# Patient Record
Sex: Female | Born: 1955 | Race: Black or African American | Hispanic: No | Marital: Single | Smoking: Never smoker
Health system: Southern US, Community
[De-identification: ages and names within clinical notes are randomized; demographics above are authoritative.]

## PROBLEM LIST (undated history)

## (undated) DIAGNOSIS — G809 Cerebral palsy, unspecified: Secondary | ICD-10-CM

## (undated) DIAGNOSIS — R569 Unspecified convulsions: Secondary | ICD-10-CM

## (undated) DIAGNOSIS — G40909 Epilepsy, unspecified, not intractable, without status epilepticus: Secondary | ICD-10-CM

## (undated) DIAGNOSIS — I10 Essential (primary) hypertension: Secondary | ICD-10-CM

## (undated) HISTORY — PX: BREAST SURGERY: SHX581

## (undated) HISTORY — DX: Unspecified convulsions: R56.9

## (undated) HISTORY — PX: OTHER SURGICAL HISTORY: SHX169

## (undated) HISTORY — PX: BACK SURGERY: SHX140

## (undated) HISTORY — DX: Epilepsy, unspecified, not intractable, without status epilepticus: G40.909

## (undated) HISTORY — PX: ABDOMINAL HYSTERECTOMY: SHX81

---

## 2006-08-28 ENCOUNTER — Inpatient Hospital Stay (HOSPITAL_COMMUNITY): Admission: EM | Admit: 2006-08-28 | Discharge: 2006-08-29 | Payer: Self-pay | Admitting: Emergency Medicine

## 2006-09-25 ENCOUNTER — Emergency Department (HOSPITAL_COMMUNITY): Admission: EM | Admit: 2006-09-25 | Discharge: 2006-09-25 | Payer: Self-pay | Admitting: Emergency Medicine

## 2006-10-31 ENCOUNTER — Emergency Department (HOSPITAL_COMMUNITY): Admission: EM | Admit: 2006-10-31 | Discharge: 2006-10-31 | Payer: Self-pay | Admitting: Emergency Medicine

## 2006-12-11 ENCOUNTER — Inpatient Hospital Stay (HOSPITAL_COMMUNITY): Admission: EM | Admit: 2006-12-11 | Discharge: 2006-12-17 | Payer: Self-pay | Admitting: Emergency Medicine

## 2007-02-01 ENCOUNTER — Encounter (HOSPITAL_COMMUNITY): Admission: RE | Admit: 2007-02-01 | Discharge: 2007-03-03 | Payer: Self-pay | Admitting: Family Medicine

## 2007-03-05 ENCOUNTER — Encounter (HOSPITAL_COMMUNITY): Admission: RE | Admit: 2007-03-05 | Discharge: 2007-04-04 | Payer: Self-pay | Admitting: Family Medicine

## 2007-04-09 ENCOUNTER — Encounter (HOSPITAL_COMMUNITY): Admission: RE | Admit: 2007-04-09 | Discharge: 2007-05-01 | Payer: Self-pay | Admitting: Family Medicine

## 2007-08-15 ENCOUNTER — Ambulatory Visit (HOSPITAL_COMMUNITY): Admission: RE | Admit: 2007-08-15 | Discharge: 2007-08-15 | Payer: Self-pay | Admitting: Family Medicine

## 2007-08-29 ENCOUNTER — Ambulatory Visit (HOSPITAL_COMMUNITY): Admission: RE | Admit: 2007-08-29 | Discharge: 2007-08-29 | Payer: Self-pay | Admitting: Family Medicine

## 2007-12-24 ENCOUNTER — Ambulatory Visit (HOSPITAL_COMMUNITY): Admission: RE | Admit: 2007-12-24 | Discharge: 2007-12-24 | Payer: Self-pay | Admitting: Family Medicine

## 2009-03-30 ENCOUNTER — Encounter: Admission: RE | Admit: 2009-03-30 | Discharge: 2009-04-30 | Payer: Self-pay | Admitting: Neurology

## 2009-06-14 IMAGING — CT CT HEAD W/O CM
1 series · 16 of 30 positions shown, 20 images · non-contrast
Comparison: None.

CLINICAL DATA: Seizure. Cerebral palsy.

HEAD CT WITHOUT CONTRAST
TECHNIQUE: 5mm collimated images were obtained from the base of the skull
through the vertex according to standard protocol without contrast.

[Series 5: headseq 4.8 h37s · axial · 0.55mm/px · z∈[+98,+250]mm · 16 of 36 slices shown, 20 images]
[im 2/36  brain]
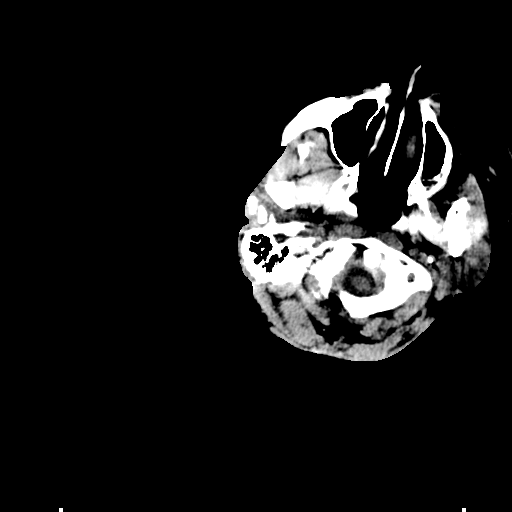
[im 2/36  bone]
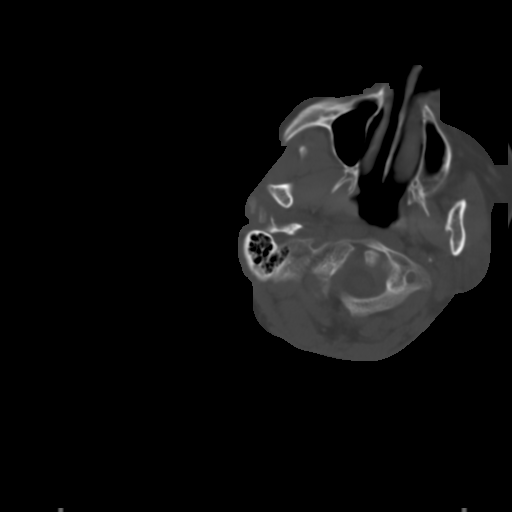
[im 4/36  brain]
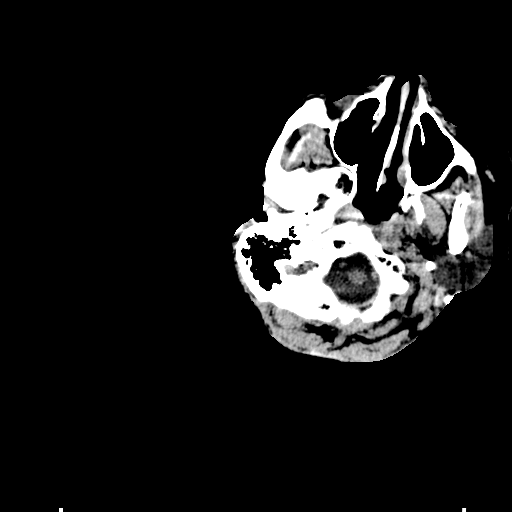
[im 7/36  brain]
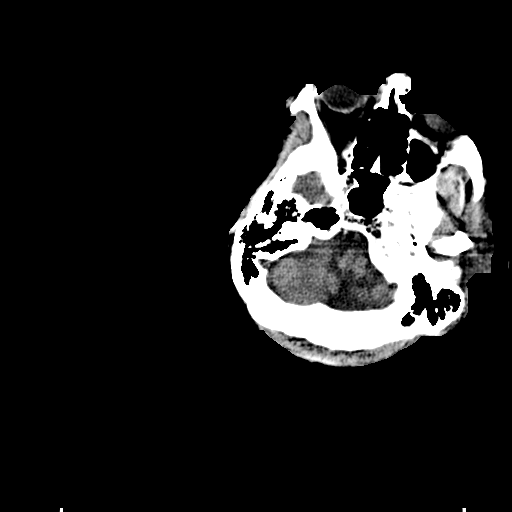
[im 9/36  brain]
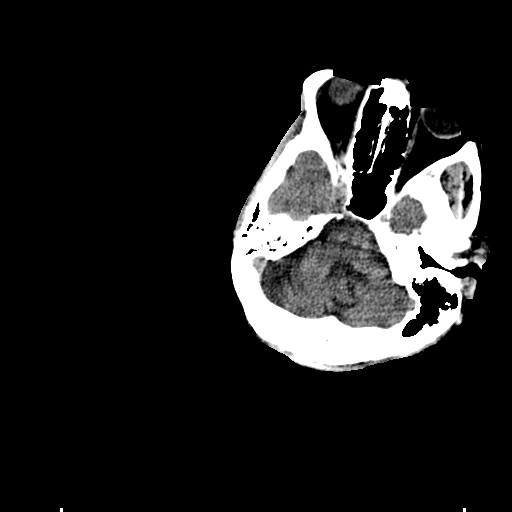
[im 10/36  brain]
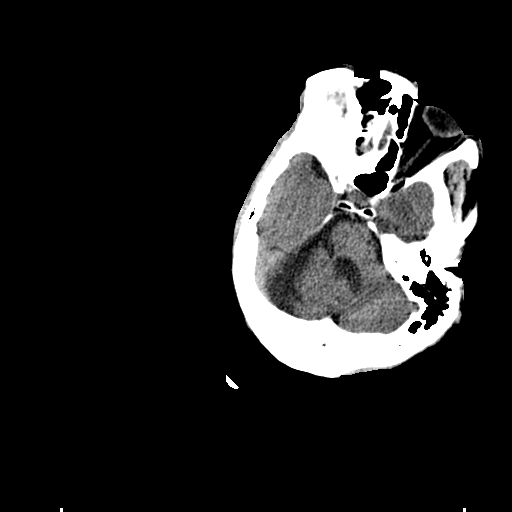
[im 10/36  bone]
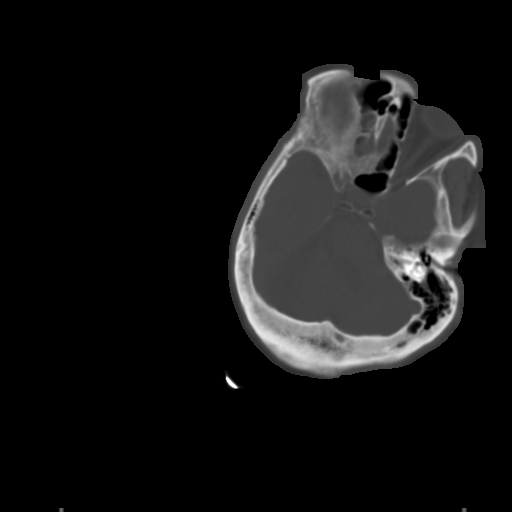
[im 13/36  brain]
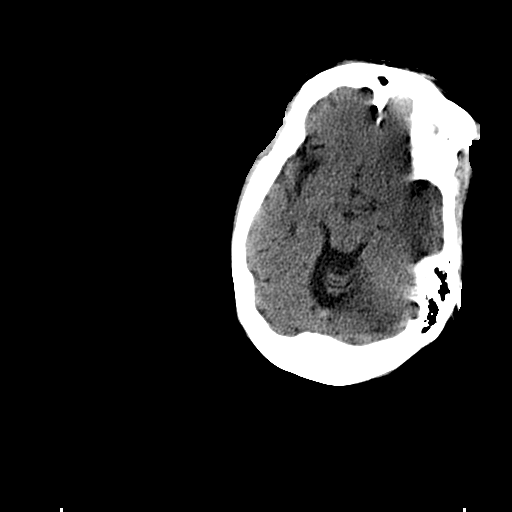
[im 15/36  brain]
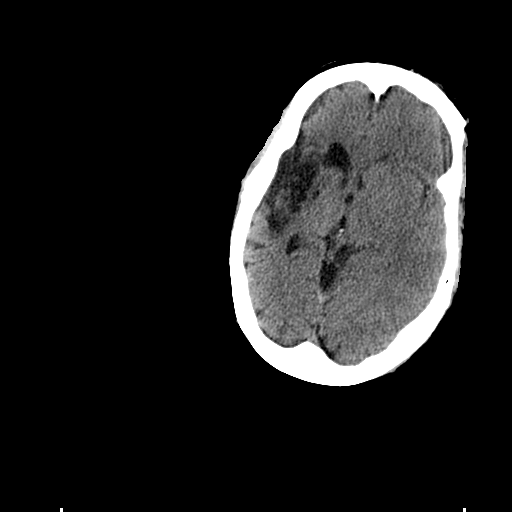
[im 17/36  brain]
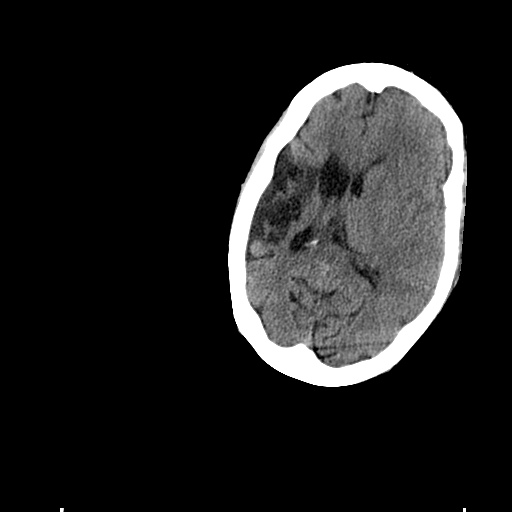
[im 19/36  brain]
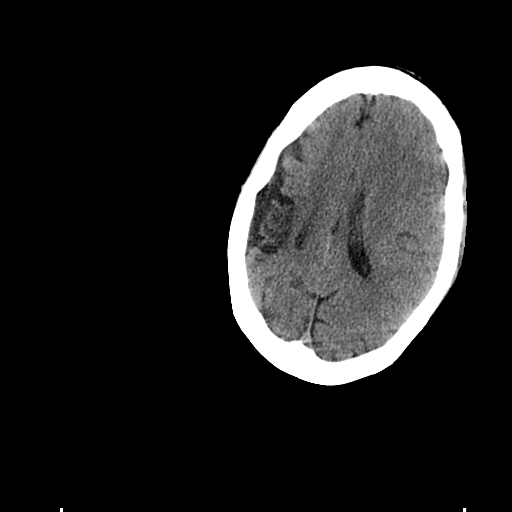
[im 19/36  bone]
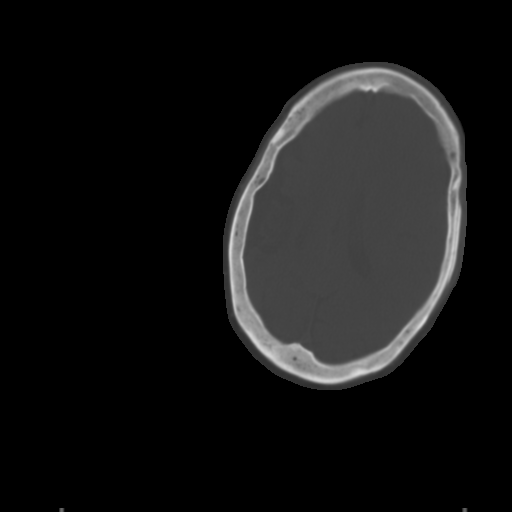
[im 21/36  brain]
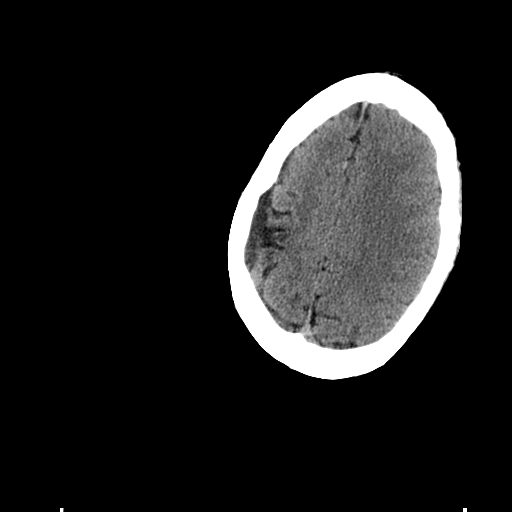
[im 23/36  brain]
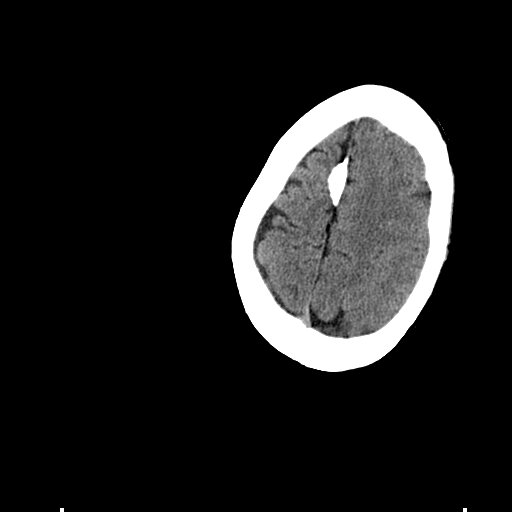
[im 26/36  brain]
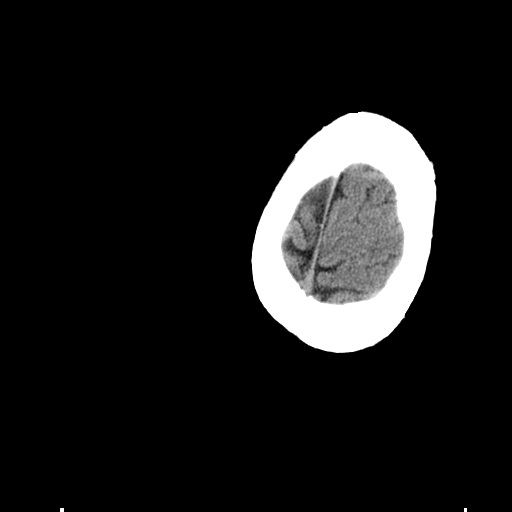
[im 27/36  brain]
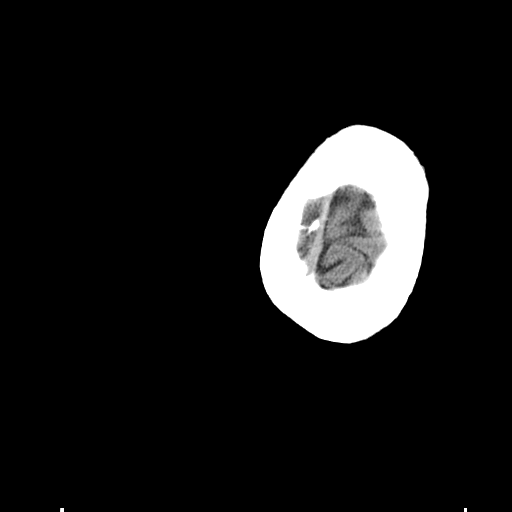
[im 27/36  bone]
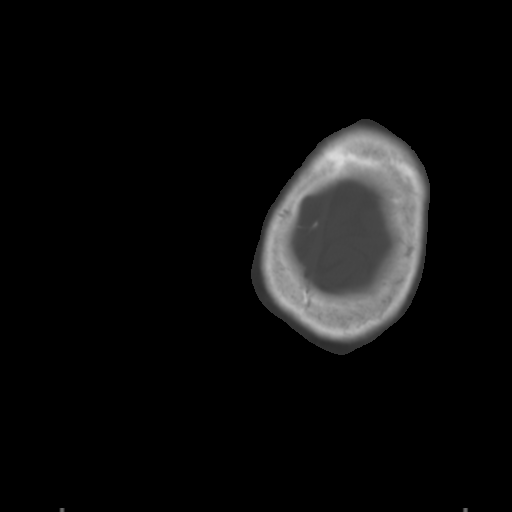
[im 29/36  brain]
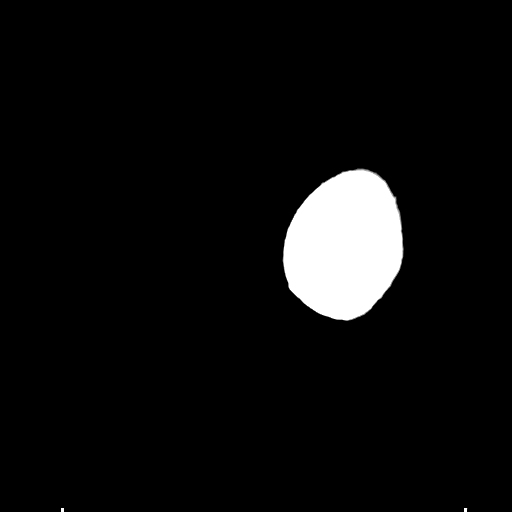
[im 32/36  brain]
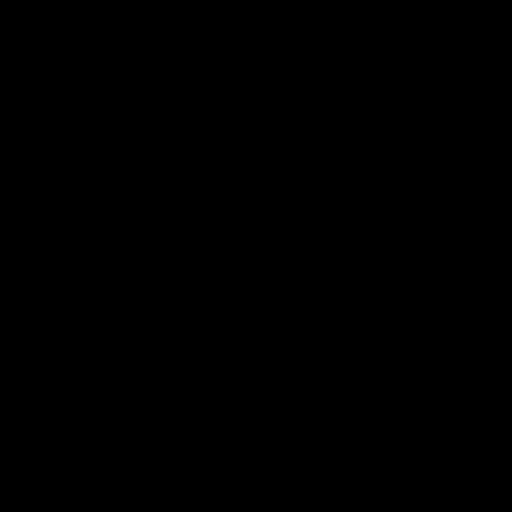
[im 34/36  brain]
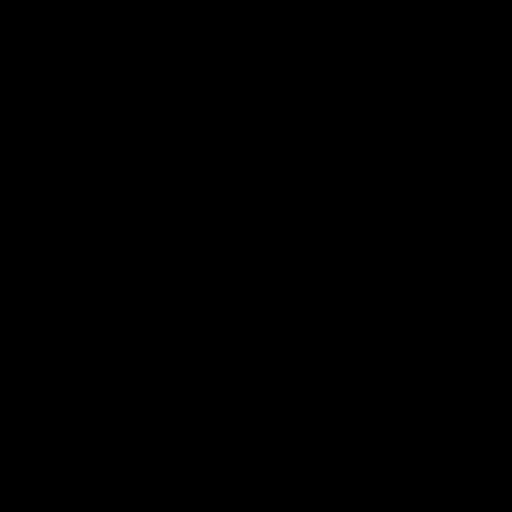

[16 of 30 positions shown; findings below may reference images not displayed]

FINDINGS: Limited ability to position the patient, resulting in an oblique
scanning plane. Mildly enlarged ventricles and subarachnoid spaces. Old right
middle cerebral artery distribution infarct with ex vacuo enlargement of the
adjacent right lateral ventricle. Old right basal ganglia lacunar infarct. No
intracranial hemorrhage or mass-effect. Unremarkable bones and included portions
of the paranasal sinuses.

IMPRESSION

1. Old right middle cerebral artery distribution infarct.
2. Mild diffuse cerebral and cerebellar atrophy.
3. No acute abnormality.

## 2010-02-04 ENCOUNTER — Encounter
Admission: RE | Admit: 2010-02-04 | Discharge: 2010-04-20 | Payer: Self-pay | Source: Home / Self Care | Attending: Family Medicine | Admitting: Family Medicine

## 2010-05-22 ENCOUNTER — Encounter: Payer: Self-pay | Admitting: Family Medicine

## 2010-07-07 ENCOUNTER — Emergency Department (HOSPITAL_COMMUNITY): Payer: Medicare Other

## 2010-07-07 ENCOUNTER — Emergency Department (HOSPITAL_COMMUNITY)
Admission: EM | Admit: 2010-07-07 | Discharge: 2010-07-07 | Disposition: A | Discharge status: Home / Self Care | Payer: Medicare Other | Attending: Emergency Medicine | Admitting: Emergency Medicine

## 2010-07-07 DIAGNOSIS — G809 Cerebral palsy, unspecified: Secondary | ICD-10-CM | POA: Insufficient documentation

## 2010-07-07 DIAGNOSIS — R51 Headache: Secondary | ICD-10-CM | POA: Insufficient documentation

## 2010-07-07 DIAGNOSIS — S0180XA Unspecified open wound of other part of head, initial encounter: Secondary | ICD-10-CM | POA: Insufficient documentation

## 2010-07-07 DIAGNOSIS — Y92009 Unspecified place in unspecified non-institutional (private) residence as the place of occurrence of the external cause: Secondary | ICD-10-CM | POA: Insufficient documentation

## 2010-07-07 DIAGNOSIS — G40909 Epilepsy, unspecified, not intractable, without status epilepticus: Secondary | ICD-10-CM | POA: Insufficient documentation

## 2010-07-07 DIAGNOSIS — W07XXXA Fall from chair, initial encounter: Secondary | ICD-10-CM | POA: Insufficient documentation

## 2010-07-13 ENCOUNTER — Emergency Department (HOSPITAL_COMMUNITY)
Admission: EM | Admit: 2010-07-13 | Discharge: 2010-07-13 | Disposition: A | Discharge status: Home / Self Care | Payer: Medicare Other | Attending: Emergency Medicine | Admitting: Emergency Medicine

## 2010-07-13 DIAGNOSIS — Z4802 Encounter for removal of sutures: Secondary | ICD-10-CM | POA: Insufficient documentation

## 2010-09-13 NOTE — Group Therapy Note (Signed)
NAMERAYHANA, SLIDER                ACCOUNT NO.:  [id number]   MEDICAL RECORD NO.:  [id number]          PATIENT TYPE:  INP   LOCATION:  A215                          FACILITY:  [facility]   PHYSICIAN:  Skeet Latch, DO    DATE OF BIRTH:  1955-05-23   DATE OF PROCEDURE:  DATE OF DISCHARGE:                                 PROGRESS NOTE   SUBJECTIVE:  Ms. Linville continues to do well on present.  No family  members present on exam.  No obvious jerking, tremors or seizure  activity, my exam.  The patient denies any chest pain, abdominal pain,  shortness of breath or headaches.  The patient is stable, in no acute  distress.   OBJECTIVE:  VITAL SIGNS:  Today temperature is 97.5, pulse 80,  respirations 20, blood pressure 122/77.  The patient is satting 99% on  room air.  CARDIOVASCULAR:  Regular rate and rhythm.  No rubs, gallops  or murmurs.  LUNGS:  Clear to auscultation bilaterally.  No rales, rhonchi or  wheezing.  ABDOMEN:  Obese, soft, nontender, nondistended, positive bowel sounds.  EXTREMITIES:  No clubbing, cyanosis or edema.   LABORATORY DATA:  Dilantin level today is 32.3, a final urine culture  negative.   ASSESSMENT:  1. Intractable epilepsy.  The patient continues to be on Keppra and is      being followed by neurology.  2. Dilantin toxicity.  The patient missed Dilantin level is slowly      trending down.  No evidence of any cardiovascular effects at this      time.   This is a 51-year [place] American female with a long history of  seizures, who presented with a 2-week history of progressive seizures.  The patient was seen on August 19, 2006 and discharged on August 29, 2006  for similar problems.  Will send him home on Keppra and brand name  Dilantin.  She presented with seizure-type activity and was found to  have a Dilantin level of approximately 40.  Neurology has been following  the patient.  The patient's Dilantin has been discontinued and has been  on Keppra, with  dosage being adjusted by neurology.  The patient as  shown no cardiovascular effects and was initially in the ICU and has  been transferred to the floor.  At this time, the patient is stable.  I  have been awaiting and/or neurology recommendations regarding the  patient can be discharged. with Dilantin level in the 30s.  Per last  note, the patient level needs to be around 25, and her Dilantin may be  read possibly be re-started.  The patient continues to do well.      Skeet Latch, DO  Electronically Signed     SM/MEDQ  D:  12/15/2006  T:  12/16/2006  Job:  [id number]

## 2010-09-13 NOTE — Procedures (Signed)
NAMELYNIX, BONINE                ACCOUNT NO.:  [id number]   MEDICAL RECORD NO.:  [id number]          PATIENT TYPE:  INP   LOCATION:  IC09                          FACILITY:  [facility]   PHYSICIAN:  Kofi A. Gerilyn Pilgrim, M.D. DATE OF BIRTH:  [birth year]   DATE OF PROCEDURE:  DATE OF DISCHARGE:                              EEG INTERPRETATION   HISTORY OF PRESENT ILLNESS:  This is a 55 year old lady who has a known  history of seizures.  She presents with frequent jerking movements  involving the upper extremities.   MEDICATIONS:  1. Keppra.  2. Dilantin.  3. Phenergan.  4. Protonix.  5. Lovenox.  6. Tylenol.   PROCEDURE:  This is a 16-channel recording that has gone on for  approximately 24 minutes.  There is a posterior rhythm of 8 Hz which  attenuates with eye opening.  Beta activity is observed in the frontal  areas.  Awake and drowsiness is observed throughout the recording.  Photic stimulation is observed without significant changes in the  background activity.  There is no focal or lateralized slowing observed.  The patient is noted to have a few sharp wave activity which phases  reverses at T4.  She also has some movement and twitches throughout the  recording but these twitches show no electrographic correlates.   IMPRESSION:  This is a normal recording showing right temporal  epileptiform discharges, however, they do not correlate with the  twitches which the patient has throughout the recording.      Kofi A. Gerilyn Pilgrim, M.D.  Electronically Signed     KAD/MEDQ  D:  12/13/2006  T:  12/13/2006  Job:  [id number]

## 2010-09-13 NOTE — Consult Note (Signed)
[name], [name]                ACCOUNT NO.:  [id number]   MEDICAL RECORD NO.:  [id number]          PATIENT TYPE:  INP   LOCATION:  IC09                          FACILITY:  [facility]   PHYSICIAN:  Kofi A. Gerilyn Pilgrim, M.D. DATE OF BIRTH:  [birth year]   DATE OF CONSULTATION:  12/12/2006  DATE OF DISCHARGE:                                 CONSULTATION   This is a 55 year old black female who has a known history of epilepsy  and mental retardation.  The patient has had intractable seizures and  was started on Keppra a few months ago.  She has had problems with  Dilantin, and this medication was reduced because of toxicity  previously.  It appears that the patient had a seizure about two weeks  ago and may have been taken to the emergency room. It is not quite clear  exactly what transpired, but she did contact our office, and they were  instructed to leave the Dilantin unchanged but to increase the Keppra.  In any situation, it appears that the patient's mother increased the  Dilantin to 3 tablets a day.  It appears that this was done about two  weeks ago.  She subsequently has had what appears to be near continuous  myoclonal jerks since that time.  When she was last seen in the  emergency room 07/02, her Dilantin level was 17 on a Dilantin dose of  200 mg a day.  The patient was taken to the emergency room today because  of this increasing myoclonic jerking activity.  She may have even had  another seizure event, but this is unclear.  The patient related that  the main thing of concern has been the near continuous jerking activity  involving the upper and lower extremities.   PAST MEDICAL HISTORY:  Again, as stated in the History of Present  Illness, cerebral palsy, mental retardation, epilepsy.   PAST SURGICAL HISTORY:  Back surgery.   ALLERGIES:  1. Codeine.  2. Benadryl.  3. Phenobarbital  4. Aspirin.   MEDICATIONS:  1. Dilantin.  The dose of Dilantin is 200 mg at bedtime, but  she was      actually taking 300 mg.  2. Keppra.  It appears that she was taking 500 mg 1 in the morning, 1      in the afternoon and 2 at bedtime.   PHYSICAL EXAMINATION:  VITAL SIGNS:  Temperature 98.5, pulse 84,  respirations 12, blood pressure 90's/61.  HEENT:  Head is normocephalic, atraumatic.  NECK:  Supple.  ABDOMEN:  Soft.  MENTATION:  Patient is awake.  She has her baseline severe dysarthria,  but converses in simple, comprehendible sentences.  She also follows  commands bilaterally.  CRANIAL NERVE EVALUATION:  She does have a strabismus but extraocular  movements are full.  Interestingly, I see no clear nystagmus.  Pupils  are 5 mm and reactive.  Facial-muscle strength is symmetric.  Tongue is  midline.  MOTOR:  Motor examination shows spasticity in both the upper and lower  extremities.  Strength in the upper extremities is 4 , and  the leg  strength is 2/5 with markedly increased spasticity.  Coordination does  show some dysmetria involving the upper extremities.  SENSATION:  Sensation is normal to light touch and temperature  bilaterally.   LABORATORY DATA:  Dilantin level on admission is 40.  It is unclear if  this was before Dilantin was bolused at 500 mg.  She did receive the  bolus before the blood levels came back, however.  Magnesium 1.9, CPK  24.  Urinalysis negative.  Sodium 143, potassium 4.0, chloride 109, CO2  34, glucose 92, BUN 9, creatinine 0.4, alkaline phosphatase 255, AST 18,  ALT 12, calcium 8.9, albumin 6.8.  WBC 3.5, hemoglobin 11, platelet  count of 234.   ASSESSMENT:  1. Intractable epilepsy.  2. Near continuous monoclonal jerks.  I suspect that this is due to      Dilantin toxicity.  She does appear to have some flapping at times.      Certainly, this could be due to epileptic myoclonus, but this has      to be ruled in or ruled out.   RECOMMENDATIONS:  She will likely need to increase the Keppra for better  seizure control.  We may go ahead  and double the dose gradually to 3  grams a day.  We should leave the maintenance dose of Dilantin at 200 mg  a day.  This will be restarted once her Dilantin level gets to about to  the low 20's.  I am also  going to recommend an EEG.   Thanks for this consultation.      Kofi A. Gerilyn Pilgrim, M.D.  Electronically Signed     KAD/MEDQ  D:  12/12/2006  T:  12/13/2006  Job:  [id number]

## 2010-09-13 NOTE — H&P (Signed)
[name], [name]                ACCOUNT NO.:  [id number]   MEDICAL RECORD NO.:  [id number]          PATIENT TYPE:  INP   LOCATION:  IC09                          FACILITY:  [facility]   PHYSICIAN:  Skeet Latch, DO    DATE OF BIRTH:  30-Jan-1956   DATE OF ADMISSION:  12/11/2006  DATE OF DISCHARGE:  LH                              HISTORY & PHYSICAL   CHIEF COMPLAINT:  Seizures.   HISTORY OF PRESENT ILLNESS:  This is a 55 year old, African-American  female who has a long history of seizures who presents with a 2- to 3-  week history of progressive seizures.  History is provided by family who  are present on exam.  Apparently, the patient has been having increasing  seizure activity over the past 2-3 weeks.  The patient has been taking  Keppra and Dilantin for these seizures for quite some time.  The patient  was seen at [facility] on August 19, 2006, and discharged on  August 29, 2006, for the same problem and was sent home on Keppra and  brand name Dilantin.  On this admission, the patient presents with  family members who state that her seizure activities have been  increasing over the past few weeks.  Her medication has not been  adjusted over that time period and family is concerned that seizures  have not been controlled.  They also state the patient has been having  some pruritus with associated rash over her neck, back and arms.  At  this time, the patient was loaded with Dilantin in the emergency room  for seizure activity.  Check of her Dilantin level showed that her level  was at 40.  Her EKG showed right axis deviation, some RVH and prolonged  QT.  On my exam, the patient seems stable in no acute distress and no  seizure activity is noted at this time.   PAST MEDICAL HISTORY:  1. Positive seizure disorder.  2. Cerebral palsy.  3. Glaucoma.   PAST SURGICAL HISTORY:  1. Hysterectomy.  2. Lump removal from both breasts.  3. Harrington rod placement of back.  4.  Heel surgery.   SOCIAL HISTORY:  Nonsmoker, nondrinker, no drug abuse.  The patient is  mentally retarded and has impaired mobility.   ALLERGIES:  ASPIRIN, PHENOBARBITAL, BENADRYL AND CODEINE.   MEDICATIONS:  1. Keppra 500 mg two tablets b.i.d.  2. Dilantin 100 mg two capsules nightly.   REVIEW OF SYSTEMS:  NEUROLOGIC:  Positive for weakness and seizure  activity.  GENERAL:  No fevers, weight loss or appetite changes.  HEENT:  Negative.  CARDIOVASCULAR:  No chest pain.  RESPIRATORY:  No cough,  dyspnea or wheeze.  GI:  Positive for some loose stools.  No nausea or  vomiting.   PHYSICAL EXAMINATION:  VITAL SIGNS:  Temperature 98.6, blood pressure  106/54, pulse 93, respirations 20.  HEENT:  Atraumatic, normocephalic.  PERRLA, EOMI.  NECK:  Soft, supple, nontender, nondistended.  CARDIAC:  Regular rate and rhythm, no murmurs, rubs or gallops.  RESPIRATORY:  Lungs were clear to auscultation bilaterally.  No rales,  rhonchi or wheezing.  ABDOMEN:  Soft, nontender, nondistended, positive bowel sounds.  No  organomegaly.  EXTREMITIES:  No clubbing, cyanosis or edema.  The patient does have  atrophy in her extremities.  SKIN:  She has a slight rash on right side of her face and on her back.   LABORATORY DATA AND X-RAY FINDINGS:  Urine microscopy shows 3-6 rbc's.  Urinalysis with small amount of blood with no nitrite or leukocytes.  Dilantin level is 40.5.  Sodium 143, potassium 4, chloride 109, CO2 34,  glucose 92, BUN 9, creatinine 0.42.  White count 3.5, hemoglobin 11.2,  hematocrit 32.9,  platelets 234.   ASSESSMENT:  This is a 55 year old, African-American female who presents  with a 2- to 3-week history of progressive seizures.  The patient has a  longstanding history of seizure disorders for which she takes Keppra and  Dilantin.  The patient presented with seizures in the emergency room and  was given a load of Dilantin.  It was checked and her Dilantin level was  40.5.  The  patient is stable at this time.  Neurology has been consulted  and emergency room physician spoke with neurology who recommended the  patient be admitted and watched due to her elevated Dilantin level.   PLAN:  The patient will be admitted to the ICU for her seizure activity  secondary to her elevated Dilantin level.  EKG showed PT prolongation.  We will get a repeat EKG in the a.m.  Neurology will be consulted and  will await neurology's recommendations.  Will hold Dilantin and Keppra  at this time to evaluate by neurology.  Will get a repeat EKG in the  a.m. as well as repeat lab studies.  The patient seems to be stable at  this time.  The patient will be placed on seizure precautions as well as  Ativan for any minor seizures at this time.  We will also consult E-Link  to manage any acute problems overnight.      Skeet Latch, DO  Electronically Signed     SM/MEDQ  D:  12/11/2006  T:  12/13/2006  Job:  [id number]   cc:   Darleen Crocker A. Gerilyn Pilgrim, M.D.  Fax: [phone]

## 2010-09-13 NOTE — Group Therapy Note (Signed)
[name], [name]                ACCOUNT NO.:  [id number]   MEDICAL RECORD NO.:  [id number]          PATIENT TYPE:  INP   LOCATION:  A215                          FACILITY:  [facility]   PHYSICIAN:  Skeet Latch, DO    DATE OF BIRTH:  12-08-1955   DATE OF PROCEDURE:  12/12/2006  DATE OF DISCHARGE:                                 PROGRESS NOTE   SUBJECTIVE:  The patient seen to be doing well at this time. The patient  is actually __________  by my exam. No obvious seizure activity at this  time. Dilantin level was repeated this a.m. and it is at 39. The patient  also had EKG which shows a PT prolongation last evening; today is  normal. Neurology has also seen the patient and the patient will get an  EEG at this time.   OBJECTIVE:  VITAL SIGNS: Temperature is 98.1, heart rate 88, blood  pressure is 91/42, respiratory rate is 22.  CARDIOVASCULAR: Regular rate and rhythm. No rubs, gallops or murmurs.  LUNGS: The lungs are clear to auscultation bilaterally. No rales,  rhonchi or wheezing.  ABDOMEN: The abdomen is soft, nontender, nondistended. Positive bowel  sounds.  EXTREMITIES: No clubbing, cyanosis or edema.   LABORATORY DATA:  Dilantin level 39.5. Troponin 0.03. Phosphorus 3.88  __________  1.9. Total creatinine is 24, CK-MB is 0.5. Sodium 142,  potassium 4.0, chloride __________ , CO2 28, glucose 102, BUN of 8,  creatinine 0.39. PTT is 49, PT is 14.9, INR is 1.4. White count was 2.9  and hemoglobin 9.7, hematocrit 28.2, platelets of 192.   ASSESSMENT AND PLAN:  1. Neurology saw the patient and EEG has been ordered. Her Dilantin      level will be rechecked in the a.m. Keppra has been increased to      1000 mg twice a day. Will hold on her Dilantin.  2. Dilantin toxicity. I will maintain Dilantin levels daily and will      hold any oral Dilantin at this time. EEG has been repeated. Was      noted to have a PT prolongation now within normal range. The      patient will be  continued to be monitored, possible transfer to the      floor in the next 1 to 2 days.      Skeet Latch, DO  Electronically Signed     SM/MEDQ  D:  12/12/2006  T:  12/13/2006  Job:  [id number]

## 2010-09-13 NOTE — Group Therapy Note (Signed)
NAMEPRISILA, DLOUHY                ACCOUNT NO.:  [id number]   MEDICAL RECORD NO.:  [id number]          PATIENT TYPE:  INP   LOCATION:  A215                          FACILITY:  [facility]   PHYSICIAN:  Skeet Latch, DO    DATE OF BIRTH:  11/19/1955   DATE OF PROCEDURE:  DATE OF DISCHARGE:                                 PROGRESS NOTE   SUBJECTIVE:  Ms. Roscher seems to be doing well today.  Family member  still states that she is still jerky which seem to be unchanged at this  time.  No obvious chest pain, shortness of breath, or any other  complaints.  At this time the patient is stable in no acute distress.   OBJECTIVE:  VITAL SIGNS:  Temperature is 97.9, respiratory rate 18,  pulse 95, blood pressure is 112/65.  CARDIOVASCULAR:  Regular rate and rhythm.  No murmurs, rubs, gallops, or  murmurs.  LUNGS:  Clear to auscultation bilaterally. No rales, rhonchi, or wheeze.  ABDOMEN:  Obese, soft, nontender, nondistended, positive bowel sounds.  EXTREMITIES:  No clubbing, cyanosis, or edema.  NEUROLOGIC:  Motor __________  lower extremity unchanged.   LABORATORY DATA:  Today the Dilantin level is 38.2, sodium 140,  potassium 4.1, chloride 109, CO2 33, glucose 90, BUN 11, creatinine  0.43.  White count is 3.5, hemoglobin 10.2, hematocrit __________ .   ASSESSMENT:  Intractable  epilepsy.  Continue __________ .  Continue to  monitor her Dilantin level.  If it __________  elevated, continue to  follow daily.  Her Keppra has been increased per neurology.  At this  time the patient is stable.  We will continue to monitor.   The patient will be transferred to a general medical bed in stable  condition.      Skeet Latch, DO  Electronically Signed     SM/MEDQ  D:  12/13/2006  T:  12/14/2006  Job:  [id number]

## 2010-09-13 NOTE — Discharge Summary (Signed)
NAMEJATOYA, ARMBRISTER                ACCOUNT NO.:  [id number]   MEDICAL RECORD NO.:  [id number]          PATIENT TYPE:  INP   LOCATION:  A215                          FACILITY:  [facility]   PHYSICIAN:  Osvaldo Shipper, MD     DATE OF BIRTH:  1955-09-25   DATE OF ADMISSION:  12/11/2006  DATE OF DISCHARGE:  08/18/2008LH                               DISCHARGE SUMMARY   The patient is followed by Dr. Gerilyn Pilgrim, neurologist.   I do not believe she has a primary medical doctor.   DISCHARGE DIAGNOSES:  1. Myoclonic jerks, unclear etiology.  2. History of seizure disorder, stable.  3. Dilantin toxicity, improved.  4. History of chest palsy, stable.   Please review H&P dictated by Dr. Lilian Kapur for details regarding  patient's presenting illness.   BRIEF HOSPITAL COURSE:  Briefly, this is a 55 year old African-American  female who has a history of cerebral palsy and as a result requires  around-the-clock nursing care at home.  She lives with her mother.  Apparently, she came in for seizure-type activity.  It looks more like  she was having myoclonic jerks.  The patient was loaded with Dilantin in  the ED; however, when they finally got the results of her Dilantin  level, she was found to have a level of 40.5.  Apparently mother had  increased the dilantin dose to 300mg  without discussing with Dr.  Gerilyn Pilgrim.  The patient was admitted to telemetry.  Dilantin levels were checked on  a daily basis, and today the levels are down to 25.  She still has  occasional myoclonic jerks of her upper extremities.  Dr. Gerilyn Pilgrim has  also seen here in the hospital stay, and he suspected that this jerking  was due to the Dilantin.  He did recommend an EEG which was done which  showed recordings showing right temporal epileptiform discharges, but  they did not correlate with the twitches that the patient was having  throughout the recording.  Yesterday, I discussed this case with Dr.  Gerilyn Pilgrim, and he said that  he is okay for the patient to go home.  Yesterday, there were problems with social situation.  Her mother is  apparently wheelchair bound and there was nobody else to take care of  the patient at home; hence, we held her discharge until today.  Otherwise, the rest of her issues are quite stable.  She is also mildly  anemic, which is also quite stable. This morning, the patient had a  bowel movement.  She does not have any complaints.  No complaints of  pain.  Her vital signs actually are quite stable.  Examination is  unchanged.  She is pretty much bed bound.   ASSESSMENT AND PLAN:  As per above.   DISCHARGE MEDICATIONS:  1. Brand name Dilantin 200 mg at bedtime.  Mother has been told not to      increase the dose at any cost.  The patient has been asked to go      back on the Dilantin starting December 20, 2006.   1. Keppra 1500 mg b.i.d.  This is a changed dose.   FOLLOWUP:  She has an appointment to see Dr. Gerilyn Pilgrim on December 19, 2006.   DIET:  She can have diet as before.   PHYSICAL ACTIVITY:  I think she is pretty much bedridden.  I am not sure  if she is able to get around with the help of the wheelchair or not.   TOTAL TIME SPENT ON DISCHARGE:  About 40 minutes.   CONSULTATIONS:  Consultations obtained from Dr. Gerilyn Pilgrim.  EEG was  performed, as discussed above.      Osvaldo Shipper, MD  Electronically Signed     GK/MEDQ  D:  12/17/2006  T:  12/17/2006  Job:  [id number]   cc:   Darleen Crocker A. Gerilyn Pilgrim, M.D.  Fax: [phone]

## 2010-09-13 NOTE — Consult Note (Signed)
[name], [name]                ACCOUNT NO.:  [id number]   MEDICAL RECORD NO.:  [id number]          PATIENT TYPE:  INP   LOCATION:  A215                          FACILITY:  [facility]   PHYSICIAN:  Kofi A. Gerilyn Pilgrim, M.D. DATE OF BIRTH:  [birth year]   DATE OF CONSULTATION:  DATE OF DISCHARGE:                                 CONSULTATION   The patient has tolerated the increased dose of Keppra without symptoms  or side effect.  Family still reports that she continues to have the  myoclonic jerks. Although she had a lot of them last night, she was able  to doze off and slept well and did not have any until this morning.  Today, she is awake and alert and essentially herself.  She was observed  for about 5 minutes and I did not see any of her myoclonic jerking  activity which she has been having quite frequently.  Her Dilantin level  from today is 29.2, from yesterday it was 32.   ASSESSMENT/PLAN:  1. Dilantin toxicity with myoclonus and asterixis.  I think this has      improved with improving dose of phenytoin.  We should continue to      hold the Dilantin and restart it Wednesday or until the dose is 25      or less.  2. Seizure disorder.  Continue with both medications.  The Keppra has      been increased to 1500 mg b.i.d.  Again, the Dilantin should remain      at 200 mg daily and I strongly recommend she should be on the brand      Dilantin only.      Kofi A. Gerilyn Pilgrim, M.D.  Electronically Signed     KAD/MEDQ  D:  12/16/2006  T:  12/16/2006  Job:  [id number]

## 2010-09-13 NOTE — Group Therapy Note (Signed)
[name], [name]                ACCOUNT NO.:  [id number]   MEDICAL RECORD NO.:  [id number]          PATIENT TYPE:  INP   LOCATION:  A215                          FACILITY:  [facility]   PHYSICIAN:  Skeet Latch, DO    DATE OF BIRTH:  [birth year]   DATE OF PROCEDURE:  12/14/2006  DATE OF DISCHARGE:                                 PROGRESS NOTE   OBJECTIVE:  Ms. Betters seems to be doing well still.  There is no  obvious seizure activity or any tremors appreciated on my exam.  The  patient's dilantin level today is 36.  The patient is sitting up in bed,  answering questions appropriately, and appears in no acute distress.   PHYSICAL EXAMINATION:  VITALS:  Temperature is 97.4, pulse 75,  respirations 20, blood pressure 106/65.  The patient is satting 100%  CARDIOVASCULAR:  Regular rate and rhythm, no murmurs, rubs, or gallops  or murmurs.  LUNGS:  Clear to auscultation bilaterally.  No rales, rhonchi, or  wheezing.  ABDOMEN:  Soft, nontender, nondistended.  Positive bowel sounds.  EXTREMITIES:  No cyanosis, clubbing, or edema is appreciated.   LABORATORY DATA:  Dilantin level is 36, __________ urine culture is  negative.   ASSESSMENT AND PLAN:  Dilantin toxicity.  The patient's dilantin level  seemed to be decreasing slowly.  The patient's Keppra was restarted  previously.  Dilantin will be restarted per neurology when her level is  approximately 25.  The patient is stable at this time.  Will defer her  to neurology when he feels the patient is stable enough for discharge.  I anticipate the patient will be discharged in the next 1-2 days if she  still remains stable.      Skeet Latch, DO  Electronically Signed     SM/MEDQ  D:  12/14/2006  T:  12/14/2006  Job:  [phone]

## 2010-10-26 ENCOUNTER — Other Ambulatory Visit (HOSPITAL_COMMUNITY): Payer: Self-pay | Admitting: Family Medicine

## 2010-10-26 ENCOUNTER — Ambulatory Visit (HOSPITAL_COMMUNITY)
Admission: RE | Admit: 2010-10-26 | Discharge: 2010-10-26 | Disposition: A | Discharge status: Home / Self Care | Payer: Medicare Other | Source: Ambulatory Visit | Attending: Family Medicine | Admitting: Family Medicine

## 2010-10-26 DIAGNOSIS — M25579 Pain in unspecified ankle and joints of unspecified foot: Secondary | ICD-10-CM | POA: Insufficient documentation

## 2010-10-26 DIAGNOSIS — M79673 Pain in unspecified foot: Secondary | ICD-10-CM

## 2010-10-26 DIAGNOSIS — M899 Disorder of bone, unspecified: Secondary | ICD-10-CM | POA: Insufficient documentation

## 2010-10-26 DIAGNOSIS — M949 Disorder of cartilage, unspecified: Secondary | ICD-10-CM | POA: Insufficient documentation

## 2010-11-25 ENCOUNTER — Other Ambulatory Visit (HOSPITAL_COMMUNITY): Payer: Self-pay | Admitting: Family Medicine

## 2010-12-01 ENCOUNTER — Ambulatory Visit (HOSPITAL_COMMUNITY)
Admission: RE | Admit: 2010-12-01 | Discharge: 2010-12-01 | Disposition: A | Discharge status: Home / Self Care | Payer: Medicare Other | Source: Ambulatory Visit | Attending: Family Medicine | Admitting: Family Medicine

## 2010-12-01 ENCOUNTER — Ambulatory Visit (HOSPITAL_COMMUNITY): Payer: Medicare Other

## 2010-12-01 DIAGNOSIS — R0789 Other chest pain: Secondary | ICD-10-CM | POA: Insufficient documentation

## 2010-12-01 DIAGNOSIS — R1310 Dysphagia, unspecified: Secondary | ICD-10-CM | POA: Insufficient documentation

## 2011-02-10 LAB — DIFFERENTIAL
Basophils Absolute: 0
Eosinophils Absolute: 0
Lymphs Abs: 1.4
Monocytes Absolute: 0.5
Neutro Abs: 2
Neutrophils Relative %: 53

## 2011-02-10 LAB — CBC
RBC: 3.48 — ABNORMAL LOW
WBC: 3.9 — ABNORMAL LOW

## 2011-02-10 LAB — BASIC METABOLIC PANEL
Calcium: 8.6
Chloride: 110
Creatinine, Ser: 0.45
GFR calc Af Amer: >60
GFR calc non Af Amer: >60

## 2011-02-10 LAB — PHENYTOIN LEVEL, TOTAL
Phenytoin Lvl: 29.2 — ABNORMAL HIGH
Phenytoin Lvl: 32.3

## 2011-02-13 LAB — COMPREHENSIVE METABOLIC PANEL
ALT: 12
Alkaline Phosphatase: 255 — ABNORMAL HIGH
BUN: 9
CO2: 34 — ABNORMAL HIGH
Calcium: 8.9
GFR calc non Af Amer: >60
Glucose, Bld: 92
Potassium: 4
Sodium: 143

## 2011-02-13 LAB — URINE CULTURE
Colony Count: NO GROWTH
Special Requests: NEGATIVE

## 2011-02-13 LAB — BASIC METABOLIC PANEL
BUN: 8
CO2: 28
Calcium: 8.6
Chloride: 109
Creatinine, Ser: 0.43
GFR calc Af Amer: >60
Potassium: 4

## 2011-02-13 LAB — DIFFERENTIAL
Basophils Relative: 1
Eosinophils Absolute: 0
Lymphs Abs: 1.4
Lymphs Abs: 1.7
Monocytes Absolute: 0.4
Monocytes Relative: 11
Monocytes Relative: 11
Neutro Abs: 1.2 — ABNORMAL LOW
Neutro Abs: 1.4 — ABNORMAL LOW
Neutrophils Relative %: 40 — ABNORMAL LOW
Neutrophils Relative %: 40 — ABNORMAL LOW
Neutrophils Relative %: 46

## 2011-02-13 LAB — APTT: aPTT: 49 — ABNORMAL HIGH

## 2011-02-13 LAB — CBC
HCT: 28.2 — ABNORMAL LOW
HCT: 32.9 — ABNORMAL LOW
Hemoglobin: 11.2 — ABNORMAL LOW
MCHC: 34.1
MCHC: 34.2
MCHC: 34.5
MCV: 90.6
Platelets: 192
RBC: 3.11 — ABNORMAL LOW
RBC: 3.31 — ABNORMAL LOW
RBC: 3.64 — ABNORMAL LOW
WBC: 2.9 — ABNORMAL LOW
WBC: 3.5 — ABNORMAL LOW

## 2011-02-13 LAB — CARDIAC PANEL(CRET KIN+CKTOT+MB+TROPI)
CK, MB: 0.6
Relative Index: INVALID
Total CK: 27
Troponin I: 0.03

## 2011-02-13 LAB — PHENYTOIN LEVEL, TOTAL: Phenytoin Lvl: 40.5

## 2011-02-13 LAB — URINE MICROSCOPIC-ADD ON

## 2011-02-13 LAB — URINALYSIS, ROUTINE W REFLEX MICROSCOPIC
Bilirubin Urine: NEGATIVE
Glucose, UA: NEGATIVE
Protein, ur: NEGATIVE

## 2011-02-13 LAB — PHOSPHORUS: Phosphorus: 3.8

## 2011-02-13 LAB — TROPONIN I: Troponin I: 0.03

## 2011-02-13 LAB — PROTIME-INR: Prothrombin Time: 14.8

## 2011-02-14 LAB — CBC
Hemoglobin: 11.6 — ABNORMAL LOW
MCHC: 34.1
RBC: 3.8 — ABNORMAL LOW
WBC: 3.7 — ABNORMAL LOW

## 2011-02-14 LAB — DIFFERENTIAL
Lymphocytes Relative: 45
Lymphs Abs: 1.6
Monocytes Absolute: 0.4
Monocytes Relative: 11
Neutro Abs: 1.6 — ABNORMAL LOW

## 2011-02-14 LAB — URINALYSIS, ROUTINE W REFLEX MICROSCOPIC
Glucose, UA: NEGATIVE
Hgb urine dipstick: NEGATIVE
Specific Gravity, Urine: 1.015
pH: 5.5

## 2011-02-14 LAB — BASIC METABOLIC PANEL
CO2: 27
Calcium: 8.8
GFR calc Af Amer: >60
GFR calc non Af Amer: >60
Sodium: 138

## 2011-06-17 DIAGNOSIS — I10 Essential (primary) hypertension: Secondary | ICD-10-CM | POA: Diagnosis not present

## 2011-06-17 DIAGNOSIS — G808 Other cerebral palsy: Secondary | ICD-10-CM | POA: Diagnosis not present

## 2011-06-17 DIAGNOSIS — G40909 Epilepsy, unspecified, not intractable, without status epilepticus: Secondary | ICD-10-CM | POA: Diagnosis not present

## 2011-06-17 DIAGNOSIS — E669 Obesity, unspecified: Secondary | ICD-10-CM | POA: Diagnosis not present

## 2011-08-02 DIAGNOSIS — R079 Chest pain, unspecified: Secondary | ICD-10-CM | POA: Diagnosis not present

## 2011-08-02 DIAGNOSIS — E7800 Pure hypercholesterolemia, unspecified: Secondary | ICD-10-CM | POA: Diagnosis not present

## 2011-08-02 DIAGNOSIS — I10 Essential (primary) hypertension: Secondary | ICD-10-CM | POA: Diagnosis not present

## 2011-08-15 DIAGNOSIS — G40909 Epilepsy, unspecified, not intractable, without status epilepticus: Secondary | ICD-10-CM | POA: Diagnosis not present

## 2011-08-30 DIAGNOSIS — J989 Respiratory disorder, unspecified: Secondary | ICD-10-CM | POA: Diagnosis not present

## 2011-09-09 DIAGNOSIS — J069 Acute upper respiratory infection, unspecified: Secondary | ICD-10-CM | POA: Diagnosis not present

## 2011-09-20 DIAGNOSIS — G40909 Epilepsy, unspecified, not intractable, without status epilepticus: Secondary | ICD-10-CM | POA: Diagnosis not present

## 2011-10-26 DIAGNOSIS — G40909 Epilepsy, unspecified, not intractable, without status epilepticus: Secondary | ICD-10-CM | POA: Diagnosis not present

## 2011-11-17 DIAGNOSIS — G40909 Epilepsy, unspecified, not intractable, without status epilepticus: Secondary | ICD-10-CM | POA: Diagnosis not present

## 2011-12-02 DIAGNOSIS — G40909 Epilepsy, unspecified, not intractable, without status epilepticus: Secondary | ICD-10-CM | POA: Diagnosis not present

## 2011-12-02 DIAGNOSIS — G809 Cerebral palsy, unspecified: Secondary | ICD-10-CM | POA: Diagnosis not present

## 2011-12-02 DIAGNOSIS — E669 Obesity, unspecified: Secondary | ICD-10-CM | POA: Diagnosis not present

## 2011-12-09 DIAGNOSIS — E7800 Pure hypercholesterolemia, unspecified: Secondary | ICD-10-CM | POA: Diagnosis not present

## 2011-12-09 DIAGNOSIS — G40909 Epilepsy, unspecified, not intractable, without status epilepticus: Secondary | ICD-10-CM | POA: Diagnosis not present

## 2011-12-09 DIAGNOSIS — G809 Cerebral palsy, unspecified: Secondary | ICD-10-CM | POA: Diagnosis not present

## 2012-01-03 DIAGNOSIS — G40219 Localization-related (focal) (partial) symptomatic epilepsy and epileptic syndromes with complex partial seizures, intractable, without status epilepticus: Secondary | ICD-10-CM | POA: Diagnosis not present

## 2012-01-03 DIAGNOSIS — M799 Soft tissue disorder, unspecified: Secondary | ICD-10-CM | POA: Diagnosis not present

## 2012-01-03 DIAGNOSIS — G809 Cerebral palsy, unspecified: Secondary | ICD-10-CM | POA: Diagnosis not present

## 2012-02-14 DIAGNOSIS — G40909 Epilepsy, unspecified, not intractable, without status epilepticus: Secondary | ICD-10-CM | POA: Diagnosis not present

## 2012-03-09 DIAGNOSIS — G40909 Epilepsy, unspecified, not intractable, without status epilepticus: Secondary | ICD-10-CM | POA: Diagnosis not present

## 2012-03-09 DIAGNOSIS — G809 Cerebral palsy, unspecified: Secondary | ICD-10-CM | POA: Diagnosis not present

## 2012-06-12 DIAGNOSIS — G40909 Epilepsy, unspecified, not intractable, without status epilepticus: Secondary | ICD-10-CM | POA: Diagnosis not present

## 2012-06-19 DIAGNOSIS — G809 Cerebral palsy, unspecified: Secondary | ICD-10-CM | POA: Diagnosis not present

## 2012-06-19 DIAGNOSIS — L97509 Non-pressure chronic ulcer of other part of unspecified foot with unspecified severity: Secondary | ICD-10-CM | POA: Diagnosis not present

## 2012-06-19 DIAGNOSIS — Z23 Encounter for immunization: Secondary | ICD-10-CM | POA: Diagnosis not present

## 2012-06-19 DIAGNOSIS — E669 Obesity, unspecified: Secondary | ICD-10-CM | POA: Diagnosis not present

## 2012-06-19 DIAGNOSIS — G40909 Epilepsy, unspecified, not intractable, without status epilepticus: Secondary | ICD-10-CM | POA: Diagnosis not present

## 2012-07-31 DIAGNOSIS — R569 Unspecified convulsions: Secondary | ICD-10-CM | POA: Diagnosis not present

## 2012-07-31 DIAGNOSIS — G809 Cerebral palsy, unspecified: Secondary | ICD-10-CM | POA: Diagnosis not present

## 2012-07-31 DIAGNOSIS — I10 Essential (primary) hypertension: Secondary | ICD-10-CM | POA: Diagnosis not present

## 2012-07-31 DIAGNOSIS — G40909 Epilepsy, unspecified, not intractable, without status epilepticus: Secondary | ICD-10-CM | POA: Diagnosis not present

## 2012-08-01 DIAGNOSIS — G809 Cerebral palsy, unspecified: Secondary | ICD-10-CM | POA: Diagnosis not present

## 2012-08-01 DIAGNOSIS — IMO0001 Reserved for inherently not codable concepts without codable children: Secondary | ICD-10-CM | POA: Diagnosis not present

## 2012-08-01 DIAGNOSIS — G40909 Epilepsy, unspecified, not intractable, without status epilepticus: Secondary | ICD-10-CM | POA: Diagnosis not present

## 2012-09-14 DIAGNOSIS — G40909 Epilepsy, unspecified, not intractable, without status epilepticus: Secondary | ICD-10-CM | POA: Diagnosis not present

## 2012-10-07 ENCOUNTER — Ambulatory Visit (INDEPENDENT_AMBULATORY_CARE_PROVIDER_SITE_OTHER): Payer: Medicare Other | Admitting: Obstetrics and Gynecology

## 2012-10-07 ENCOUNTER — Ambulatory Visit: Payer: Medicare Other | Admitting: Obstetrics and Gynecology

## 2012-10-07 ENCOUNTER — Encounter: Payer: Self-pay | Admitting: *Deleted

## 2012-10-07 ENCOUNTER — Encounter: Payer: Self-pay | Admitting: Obstetrics and Gynecology

## 2012-10-07 DIAGNOSIS — Z1239 Encounter for other screening for malignant neoplasm of breast: Secondary | ICD-10-CM

## 2012-10-07 MED ORDER — NYSTATIN 100000 UNIT/GM EX CREA: TOPICAL_CREAM | Freq: Two times a day (BID) | CUTANEOUS | Status: DC

## 2012-10-07 NOTE — Progress Notes (Deleted)
  Subjective:     [name] is a 57 y.o. female is here for a comprehensive physical exam. The patient reports {problems:16946}.  History   Social History  . Marital Status: Single    Spouse Name: N/A    Number of Children: N/A  . Years of Education: N/A   Occupational History  . Not on file.   Social History Main Topics  . Smoking status: Not on file  . Smokeless tobacco: Not on file  . Alcohol Use: Not on file  . Drug Use: Not on file  . Sexually Active: Not on file   Other Topics Concern  . Not on file   Social History Narrative  . No narrative on file   No health maintenance topics applied.     Review of Systems A comprehensive review of systems was negative.   Objective:      GENERAL: Well-developed, well-nourished female in no acute distress. Wheelchair bound  HEENT: Normocephalic, atraumatic. Sclerae anicteric.  NECK: Supple. Normal thyroid.  LUNGS: Clear to auscultation bilaterally.  HEART: Regular rate and rhythm. BREASTS: Symmetric in size. No palpable masses or lymphadenopathy, skin changes, or nipple drainage. ABDOMEN: Soft, nontender, nondistended. No organomegaly. PELVIC: Normal external female genitalia. Vagina is pink and rugated.  Normal discharge. Normal appearing cervix. Uterus is normal in size. No adnexal mass or tenderness. EXTREMITIES: No cyanosis, clubbing, or edema, 2+ distal pulses.    Assessment:    Healthy female exam.      Plan:    Pap smear collected Referral for screening mammogram provided See After Visit Summary for Counseling Recommendations

## 2012-10-07 NOTE — Progress Notes (Signed)
  Subjective:    Patient ID: [name], female    DOB: 1955-10-29, 57 y.o.   MRN: [id number]  HPI 57 yo G0 presenting today for evaluation of vaginal lesion. Patient is wheel chair bound secondary to cerebral palsy and is here in the presence of her sister who is the power of attorney. Her sister reports presence of bumps on vagina for at least a year associated with severe pruritis. Patient has had a hysterectomy secondary to fibroid uterus in her 30's and denies any abnormal vaginal bleeding or discharge. Patient is always wearing a diaper and sister states that   Past Medical History  Diagnosis Date  . Epilepsy     No past surgical history on file. History  Substance Use Topics  . Smoking status: Not on file  . Smokeless tobacco: Not on file  . Alcohol Use: Not on file   No family history on file.    Review of Systems     Objective:   Physical Exam GENERAL: Well-developed, well-nourished female in no acute distress with body contracture.  HEENT: Normocephalic, atraumatic. Sclerae anicteric.  BREASTS: Symmetric in size. No palpable masses or lymphadenopathy, skin changes, or nipple drainage. ABDOMEN: Soft, nontender, nondistended. Obses. PELVIC: Normal external female genitalia with areas of excoriations, 2 small less than 0.5 mm inclusion cysts located on right labia majora. Vagina is pink and rugated. Vaginal vault is intact. Bimanual exam is limited secondary to body habitus and body contracture. No adnexal mass or tenderness. EXTREMITIES: No cyanosis, clubbing, or edema, 2+ distal pulses.     Assessment & Plan:  57 yo with vulva pruritus likely secondary to yeast infection - Rx nystatin cream provided - Referral for screening mammogram also provided - RTC for further evaluation or complaints.

## 2012-10-07 NOTE — Patient Instructions (Signed)
Preventive Care for Adults, Female A healthy lifestyle and preventive care can promote health and wellness. Preventive health guidelines for women include the following key practices.  A routine yearly physical is a good way to check with your caregiver about your health and preventive screening. It is a chance to share any concerns and updates on your health, and to receive a thorough exam.  Visit your dentist for a routine exam and preventive care every 6 months. Brush your teeth twice a day and floss once a day. Good oral hygiene prevents tooth decay and gum disease.  The frequency of eye exams is based on your age, health, family medical history, use of contact lenses, and other factors. Follow your caregiver's recommendations for frequency of eye exams.  Eat a healthy diet. Foods like vegetables, fruits, whole grains, low-fat dairy products, and lean protein foods contain the nutrients you need without too many calories. Decrease your intake of foods high in solid fats, added sugars, and salt. Eat the right amount of calories for you.Get information about a proper diet from your caregiver, if necessary.  Regular physical exercise is one of the most important things you can do for your health. Most adults should get at least 150 minutes of moderate-intensity exercise (any activity that increases your heart rate and causes you to sweat) each week. In addition, most adults need muscle-strengthening exercises on 2 or more days a week.  Maintain a healthy weight. The body mass index (BMI) is a screening tool to identify possible weight problems. It provides an estimate of body fat based on height and weight. Your caregiver can help determine your BMI, and can help you achieve or maintain a healthy weight.For adults 20 years and older:  A BMI below 18.5 is considered underweight.  A BMI of 18.5 to 24.9 is normal.  A BMI of 25 to 29.9 is considered overweight.  A BMI of 30 and above is  considered obese.  Maintain normal blood lipids and cholesterol levels by exercising and minimizing your intake of saturated fat. Eat a balanced diet with plenty of fruit and vegetables. Blood tests for lipids and cholesterol should begin at age 20 and be repeated every 5 years. If your lipid or cholesterol levels are high, you are over 50, or you are at high risk for heart disease, you may need your cholesterol levels checked more frequently.Ongoing high lipid and cholesterol levels should be treated with medicines if diet and exercise are not effective.  If you smoke, find out from your caregiver how to quit. If you do not use tobacco, do not start.  If you are pregnant, do not drink alcohol. If you are breastfeeding, be very cautious about drinking alcohol. If you are not pregnant and choose to drink alcohol, do not exceed 1 drink per day. One drink is considered to be 12 ounces (355 mL) of beer, 5 ounces (148 mL) of wine, or 1.5 ounces (44 mL) of liquor.  Avoid use of street drugs. Do not share needles with anyone. Ask for help if you need support or instructions about stopping the use of drugs.  High blood pressure causes heart disease and increases the risk of stroke. Your blood pressure should be checked at least every 1 to 2 years. Ongoing high blood pressure should be treated with medicines if weight loss and exercise are not effective.  If you are 55 to 56 years old, ask your caregiver if you should take aspirin to prevent strokes.  Diabetes   screening involves taking a blood sample to check your fasting blood sugar level. This should be done once every 3 years, after age 45, if you are within normal weight and without risk factors for diabetes. Testing should be considered at a younger age or be carried out more frequently if you are overweight and have at least 1 risk factor for diabetes.  Breast cancer screening is essential preventive care for women. You should practice "breast  self-awareness." This means understanding the normal appearance and feel of your breasts and may include breast self-examination. Any changes detected, no matter how small, should be reported to a caregiver. Women in their 20s and 30s should have a clinical breast exam (CBE) by a caregiver as part of a regular health exam every 1 to 3 years. After age 40, women should have a CBE every year. Starting at age 40, women should consider having a mammography (breast X-ray test) every year. Women who have a family history of breast cancer should talk to their caregiver about genetic screening. Women at a high risk of breast cancer should talk to their caregivers about having magnetic resonance imaging (MRI) and a mammography every year.  The Pap test is a screening test for cervical cancer. A Pap test can show cell changes on the cervix that might become cervical cancer if left untreated. A Pap test is a procedure in which cells are obtained and examined from the lower end of the uterus (cervix).  Women should have a Pap test starting at age 21.  Between ages 21 and 29, Pap tests should be repeated every 2 years.  Beginning at age 30, you should have a Pap test every 3 years as long as the past 3 Pap tests have been normal.  Some women have medical problems that increase the chance of getting cervical cancer. Talk to your caregiver about these problems. It is especially important to talk to your caregiver if a new problem develops soon after your last Pap test. In these cases, your caregiver may recommend more frequent screening and Pap tests.  The above recommendations are the same for women who have or have not gotten the vaccine for human papillomavirus (HPV).  If you had a hysterectomy for a problem that was not cancer or a condition that could lead to cancer, then you no longer need Pap tests. Even if you no longer need a Pap test, a regular exam is a good idea to make sure no other problems are  starting.  If you are between ages 65 and 70, and you have had normal Pap tests going back 10 years, you no longer need Pap tests. Even if you no longer need a Pap test, a regular exam is a good idea to make sure no other problems are starting.  If you have had past treatment for cervical cancer or a condition that could lead to cancer, you need Pap tests and screening for cancer for at least 20 years after your treatment.  If Pap tests have been discontinued, risk factors (such as a new sexual partner) need to be reassessed to determine if screening should be resumed.  The HPV test is an additional test that may be used for cervical cancer screening. The HPV test looks for the virus that can cause the cell changes on the cervix. The cells collected during the Pap test can be tested for HPV. The HPV test could be used to screen women aged 30 years and older, and should   be used in women of any age who have unclear Pap test results. After the age of 30, women should have HPV testing at the same frequency as a Pap test.  Colorectal cancer can be detected and often prevented. Most routine colorectal cancer screening begins at the age of 50 and continues through age 75. However, your caregiver may recommend screening at an earlier age if you have risk factors for colon cancer. On a yearly basis, your caregiver may provide home test kits to check for hidden blood in the stool. Use of a small camera at the end of a tube, to directly examine the colon (sigmoidoscopy or colonoscopy), can detect the earliest forms of colorectal cancer. Talk to your caregiver about this at age 50, when routine screening begins. Direct examination of the colon should be repeated every 5 to 10 years through age 75, unless early forms of pre-cancerous polyps or small growths are found.  Hepatitis C blood testing is recommended for all people born from 1945 through 1965 and any individual with known risks for hepatitis C.  Practice  safe sex. Use condoms and avoid high-risk sexual practices to reduce the spread of sexually transmitted infections (STIs). STIs include gonorrhea, chlamydia, syphilis, trichomonas, herpes, HPV, and human immunodeficiency virus (HIV). Herpes, HIV, and HPV are viral illnesses that have no cure. They can result in disability, cancer, and death. Sexually active women aged 25 and younger should be checked for chlamydia. Older women with new or multiple partners should also be tested for chlamydia. Testing for other STIs is recommended if you are sexually active and at increased risk.  Osteoporosis is a disease in which the bones lose minerals and strength with aging. This can result in serious bone fractures. The risk of osteoporosis can be identified using a bone density scan. Women ages 65 and over and women at risk for fractures or osteoporosis should discuss screening with their caregivers. Ask your caregiver whether you should take a calcium supplement or vitamin D to reduce the rate of osteoporosis.  Menopause can be associated with physical symptoms and risks. Hormone replacement therapy is available to decrease symptoms and risks. You should talk to your caregiver about whether hormone replacement therapy is right for you.  Use sunscreen with sun protection factor (SPF) of 30 or more. Apply sunscreen liberally and repeatedly throughout the day. You should seek shade when your shadow is shorter than you. Protect yourself by wearing long sleeves, pants, a wide-brimmed hat, and sunglasses year round, whenever you are outdoors.  Once a month, do a whole body skin exam, using a mirror to look at the skin on your back. Notify your caregiver of new moles, moles that have irregular borders, moles that are larger than a pencil eraser, or moles that have changed in shape or color.  Stay current with required immunizations.  Influenza. You need a dose every fall (or winter). The composition of the flu vaccine  changes each year, so being vaccinated once is not enough.  Pneumococcal polysaccharide. You need 1 to 2 doses if you smoke cigarettes or if you have certain chronic medical conditions. You need 1 dose at age 65 (or older) if you have never been vaccinated.  Tetanus, diphtheria, pertussis (Tdap, Td). Get 1 dose of Tdap vaccine if you are younger than age 65, are over 65 and have contact with an infant, are a healthcare worker, are pregnant, or simply want to be protected from whooping cough. After that, you need a Td   booster dose every 10 years. Consult your caregiver if you have not had at least 3 tetanus and diphtheria-containing shots sometime in your life or have a deep or dirty wound.  HPV. You need this vaccine if you are a woman age 26 or younger. The vaccine is given in 3 doses over 6 months.  Measles, mumps, rubella (MMR). You need at least 1 dose of MMR if you were born in 1957 or later. You may also need a second dose.  Meningococcal. If you are age 19 to 21 and a first-year college student living in a residence hall, or have one of several medical conditions, you need to get vaccinated against meningococcal disease. You may also need additional booster doses.  Zoster (shingles). If you are age 60 or older, you should get this vaccine.  Varicella (chickenpox). If you have never had chickenpox or you were vaccinated but received only 1 dose, talk to your caregiver to find out if you need this vaccine.  Hepatitis A. You need this vaccine if you have a specific risk factor for hepatitis A virus infection or you simply wish to be protected from this disease. The vaccine is usually given as 2 doses, 6 to 18 months apart.  Hepatitis B. You need this vaccine if you have a specific risk factor for hepatitis B virus infection or you simply wish to be protected from this disease. The vaccine is given in 3 doses, usually over 6 months. Preventive Services / Frequency Ages 19 to 39  Blood  pressure check.** / Every 1 to 2 years.  Lipid and cholesterol check.** / Every 5 years beginning at age 20.  Clinical breast exam.** / Every 3 years for women in their 20s and 30s.  Pap test.** / Every 2 years from ages 21 through 29. Every 3 years starting at age 30 through age 65 or 70 with a history of 3 consecutive normal Pap tests.  HPV screening.** / Every 3 years from ages 30 through ages 65 to 70 with a history of 3 consecutive normal Pap tests.  Hepatitis C blood test.** / For any individual with known risks for hepatitis C.  Skin self-exam. / Monthly.  Influenza immunization.** / Every year.  Pneumococcal polysaccharide immunization.** / 1 to 2 doses if you smoke cigarettes or if you have certain chronic medical conditions.  Tetanus, diphtheria, pertussis (Tdap, Td) immunization. / A one-time dose of Tdap vaccine. After that, you need a Td booster dose every 10 years.  HPV immunization. / 3 doses over 6 months, if you are 26 and younger.  Measles, mumps, rubella (MMR) immunization. / You need at least 1 dose of MMR if you were born in 1957 or later. You may also need a second dose.  Meningococcal immunization. / 1 dose if you are age 19 to 21 and a first-year college student living in a residence hall, or have one of several medical conditions, you need to get vaccinated against meningococcal disease. You may also need additional booster doses.  Varicella immunization.** / Consult your caregiver.  Hepatitis A immunization.** / Consult your caregiver. 2 doses, 6 to 18 months apart.  Hepatitis B immunization.** / Consult your caregiver. 3 doses usually over 6 months. Ages 40 to 64  Blood pressure check.** / Every 1 to 2 years.  Lipid and cholesterol check.** / Every 5 years beginning at age 20.  Clinical breast exam.** / Every year after age 40.  Mammogram.** / Every year beginning at age 40   and continuing for as long as you are in good health. Consult with your  caregiver.  Pap test.** / Every 3 years starting at age 30 through age 65 or 70 with a history of 3 consecutive normal Pap tests.  HPV screening.** / Every 3 years from ages 30 through ages 65 to 70 with a history of 3 consecutive normal Pap tests.  Fecal occult blood test (FOBT) of stool. / Every year beginning at age 50 and continuing until age 75. You may not need to do this test if you get a colonoscopy every 10 years.  Flexible sigmoidoscopy or colonoscopy.** / Every 5 years for a flexible sigmoidoscopy or every 10 years for a colonoscopy beginning at age 50 and continuing until age 75.  Hepatitis C blood test.** / For all people born from 1945 through 1965 and any individual with known risks for hepatitis C.  Skin self-exam. / Monthly.  Influenza immunization.** / Every year.  Pneumococcal polysaccharide immunization.** / 1 to 2 doses if you smoke cigarettes or if you have certain chronic medical conditions.  Tetanus, diphtheria, pertussis (Tdap, Td) immunization.** / A one-time dose of Tdap vaccine. After that, you need a Td booster dose every 10 years.  Measles, mumps, rubella (MMR) immunization. / You need at least 1 dose of MMR if you were born in 1957 or later. You may also need a second dose.  Varicella immunization.** / Consult your caregiver.  Meningococcal immunization.** / Consult your caregiver.  Hepatitis A immunization.** / Consult your caregiver. 2 doses, 6 to 18 months apart.  Hepatitis B immunization.** / Consult your caregiver. 3 doses, usually over 6 months. Ages 65 and over  Blood pressure check.** / Every 1 to 2 years.  Lipid and cholesterol check.** / Every 5 years beginning at age 20.  Clinical breast exam.** / Every year after age 40.  Mammogram.** / Every year beginning at age 40 and continuing for as long as you are in good health. Consult with your caregiver.  Pap test.** / Every 3 years starting at age 30 through age 65 or 70 with a 3  consecutive normal Pap tests. Testing can be stopped between 65 and 70 with 3 consecutive normal Pap tests and no abnormal Pap or HPV tests in the past 10 years.  HPV screening.** / Every 3 years from ages 30 through ages 65 or 70 with a history of 3 consecutive normal Pap tests. Testing can be stopped between 65 and 70 with 3 consecutive normal Pap tests and no abnormal Pap or HPV tests in the past 10 years.  Fecal occult blood test (FOBT) of stool. / Every year beginning at age 50 and continuing until age 75. You may not need to do this test if you get a colonoscopy every 10 years.  Flexible sigmoidoscopy or colonoscopy.** / Every 5 years for a flexible sigmoidoscopy or every 10 years for a colonoscopy beginning at age 50 and continuing until age 75.  Hepatitis C blood test.** / For all people born from 1945 through 1965 and any individual with known risks for hepatitis C.  Osteoporosis screening.** / A one-time screening for women ages 65 and over and women at risk for fractures or osteoporosis.  Skin self-exam. / Monthly.  Influenza immunization.** / Every year.  Pneumococcal polysaccharide immunization.** / 1 dose at age 65 (or older) if you have never been vaccinated.  Tetanus, diphtheria, pertussis (Tdap, Td) immunization. / A one-time dose of Tdap vaccine if you are over   65 and have contact with an infant, are a healthcare worker, or simply want to be protected from whooping cough. After that, you need a Td booster dose every 10 years.  Varicella immunization.** / Consult your caregiver.  Meningococcal immunization.** / Consult your caregiver.  Hepatitis A immunization.** / Consult your caregiver. 2 doses, 6 to 18 months apart.  Hepatitis B immunization.** / Check with your caregiver. 3 doses, usually over 6 months. ** Family history and personal history of risk and conditions may change your caregiver's recommendations. Document Released: 06/13/2001 Document Revised: 07/10/2011  Document Reviewed: 09/12/2010 [employer] Patient Information 2014 [employer].  

## 2012-10-08 NOTE — Progress Notes (Signed)
Patient ID: [name], female   DOB: 1955-11-05, 57 y.o.   MRN: [id number] Erroneous encounter

## 2012-12-07 DIAGNOSIS — E669 Obesity, unspecified: Secondary | ICD-10-CM | POA: Diagnosis not present

## 2012-12-07 DIAGNOSIS — G809 Cerebral palsy, unspecified: Secondary | ICD-10-CM | POA: Diagnosis not present

## 2012-12-07 DIAGNOSIS — G40909 Epilepsy, unspecified, not intractable, without status epilepticus: Secondary | ICD-10-CM | POA: Diagnosis not present

## 2012-12-14 DIAGNOSIS — G809 Cerebral palsy, unspecified: Secondary | ICD-10-CM | POA: Diagnosis not present

## 2012-12-14 DIAGNOSIS — G40909 Epilepsy, unspecified, not intractable, without status epilepticus: Secondary | ICD-10-CM | POA: Diagnosis not present

## 2013-02-11 DIAGNOSIS — J069 Acute upper respiratory infection, unspecified: Secondary | ICD-10-CM | POA: Diagnosis not present

## 2013-02-18 ENCOUNTER — Encounter: Payer: Self-pay | Admitting: Nurse Practitioner

## 2013-02-18 ENCOUNTER — Ambulatory Visit (INDEPENDENT_AMBULATORY_CARE_PROVIDER_SITE_OTHER): Payer: Medicare Other | Admitting: Nurse Practitioner

## 2013-02-18 VITALS — BP 116/79 | HR 76

## 2013-02-18 DIAGNOSIS — Z79899 Other long term (current) drug therapy: Secondary | ICD-10-CM | POA: Diagnosis not present

## 2013-02-18 DIAGNOSIS — Z5181 Encounter for therapeutic drug level monitoring: Secondary | ICD-10-CM | POA: Insufficient documentation

## 2013-02-18 DIAGNOSIS — G809 Cerebral palsy, unspecified: Secondary | ICD-10-CM

## 2013-02-18 DIAGNOSIS — G40219 Localization-related (focal) (partial) symptomatic epilepsy and epileptic syndromes with complex partial seizures, intractable, without status epilepticus: Secondary | ICD-10-CM

## 2013-02-18 MED ORDER — LEVETIRACETAM 750 MG PO TABS: 1500.0000 mg | ORAL_TABLET | Freq: Two times a day (BID) | ORAL | Status: DC

## 2013-02-18 MED ORDER — LAMOTRIGINE 150 MG PO TABS: 150.0000 mg | ORAL_TABLET | Freq: Two times a day (BID) | ORAL | Status: DC

## 2013-02-18 MED ORDER — PHENYTOIN SODIUM EXTENDED 100 MG PO CAPS: 100.0000 mg | ORAL_CAPSULE | Freq: Two times a day (BID) | ORAL | Status: DC

## 2013-02-18 NOTE — Patient Instructions (Signed)
Patient to continue Dilantin, Keppra, and Lamictal at current doses will refill Plenty of fluids for hydration We will get labs today Followup yearly and when necessary

## 2013-02-18 NOTE — Progress Notes (Signed)
[facility]  PATIENT: [name] DOB: [birth year]   REASON FOR VISIT: follow up for seizures    HISTORY OF PRESENT ILLNESS: [name] is seen today after last visit 01/03/12.   She has a long standing history of cerebral palsy and seizure disorder as well as myoclonic jerks. She has not had generalized seizure in now for more than 6 years She does have  occasional myoclonic jerks which are transient .She does take p.r.n. ativan which helps but takes it only when the jerking is prolonged.   Patient has tried tapering and stopping Dilantin in the past without success as  this led to withdrawal seizures   She is troubled by left hand developing forced flexion deformity. She however denies pain and discomfort.  She has not had any new health concerns since last visit except mild weight gain and being treated for respiratory infection. No recent labs     REVIEW OF SYSTEMS: Full 14 system review of systems performed and notable only for:  Constitutional: N/A  Cardiovascular: N/A  Ear/Nose/Throat: N/A  Skin: N/A  Eyes: N/A  Respiratory: Cough  Gastroitestinal: Constipation Hematology/Lymphatic: N/A  Endocrine: Feeling hot Musculoskeletal:N/A  Allergy/Immunology: Runny nose  Neurological: N/A Psychiatric: N/A   ALLERGIES: Allergies  Allergen Reactions  . Aspirin   . Benadryl [Diphenhydramine Hcl (Sleep)]   . Codeine   . Phenobarbital     HOME MEDICATIONS: Outpatient Prescriptions Prior to Visit  Medication Sig Dispense Refill  . amLODipine (NORVASC) 5 MG tablet Take 5 mg by mouth daily.      . carvedilol (COREG) 6.25 MG tablet Take 6.25 mg by mouth 2 (two) times daily with a meal.      . famotidine (PEPCID) 20 MG tablet Take 20 mg by mouth 2 (two) times daily.      . hydrochlorothiazide (HYDRODIURIL) 25 MG tablet Take 12.5 mg by mouth daily.      Marland Kitchen lamoTRIgine (LAMICTAL) 150 MG tablet Take 150 mg by mouth 2 (two) times daily.      Marland Kitchen levETIRAcetam (KEPPRA)  750 MG tablet Take 1,500 mg by mouth every 12 (twelve) hours.      Marland Kitchen nystatin cream (MYCOSTATIN) Apply topically 2 (two) times daily.  30 g  1  . phenytoin (DILANTIN) 100 MG ER capsule Take 100 mg by mouth 2 (two) times daily.      . simvastatin (ZOCOR) 10 MG tablet Take 10 mg by mouth at bedtime.      . Skin Protectants, Misc. [facility] SKIN PROTECTANT) 50 % OINT Apply topically.       No facility-administered medications prior to visit.    PAST MEDICAL HISTORY: Past Medical History  Diagnosis Date  . Epilepsy     PAST SURGICAL HISTORY: History reviewed. No pertinent past surgical history.  FAMILY HISTORY: History reviewed. No pertinent family history.  SOCIAL HISTORY: History   Social History  . Marital Status: Single    Spouse Name: N/A    Number of Children: N/A  . Years of Education: N/A   Occupational History  . Not on file.   Social History Main Topics  . Smoking status: Never Smoker   . Smokeless tobacco: Never Used  . Alcohol Use: Yes  . Drug Use: No  . Sexual Activity: Not on file   Other Topics Concern  . Not on file   Social History Narrative  . No narrative on file     PHYSICAL EXAM  Filed Vitals:   02/18/13  1424  BP: 116/79  Pulse: 76   Cannot calculate BMI with a height equal to zero.  Generalized: Well developed, in no acute distress  Head: normocephalic and atraumatic,. Oropharynx benign  Neck: Supple, no carotid bruits  Cardiac: Regular rate rhythm, no murmur  Musculoskeletal: No deformity   Neurological examination   Mentation: Alert oriented to time, place, history taking. Follows all commands,  speech dysarthric.  Cranial nerve II-XII: Pupils were equal round reactive to light extraocular movements were full, visual field were full on confrontational test. Mild left facial weakness.  hearing was intact to finger rubbing bilaterally. Uvula tongue midline. head turning and shoulder shrug and were normal and symmetric.Tongue  protrusion into cheek strength was normal. 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  Reflexes: Brisker on the left than right. Gait and Station: Not ambulated , in wheelchair   DIAGNOSTIC DATA (LABS, IMAGING, TESTING) -None to review  ASSESSMENT AND PLAN  57 y.o. year old female  has a past medical history of Epilepsy. here for followup. She also has a history of cerebral palsy. She has some breakthrough myoclonic jerks are not bothersome. Last generalized seizure was 6 years ago  Patient to continue Dilantin, Keppra, and Lamictal at current doses, will refill Plenty of fluids for hydration We will get labs today Followup yearly and when necessary Nilda Riggs, [facility], [facility], APRN  [facility] [address], [address] [place], [place] [address] [phone]

## 2013-02-19 LAB — COMPREHENSIVE METABOLIC PANEL
Albumin: 4.1 g/dL (ref 3.5–5.5)
Alkaline Phosphatase: 336 IU/L — ABNORMAL HIGH (ref 39–117)
BUN/Creatinine Ratio: 15 (ref 9–23)
BUN: 9 mg/dL (ref 6–24)
CO2: 26 mmol/L (ref 18–29)
Chloride: 100 mmol/L (ref 97–108)
Creatinine, Ser: 0.59 mg/dL (ref 0.57–1.00)
GFR calc Af Amer: 118 mL/min/{1.73_m2} (ref >59)
Globulin, Total: 2.7 g/dL (ref 1.5–4.5)
Glucose: 81 mg/dL (ref 65–99)
Total Protein: 6.8 g/dL (ref 6.0–8.5)

## 2013-02-19 LAB — CBC WITH DIFFERENTIAL/PLATELET
Eos: 0 %
HCT: 36.4 % (ref 34.0–46.6)
Hemoglobin: 12.3 g/dL (ref 11.1–15.9)
Immature Granulocytes: 0 %
Lymphocytes Absolute: 1.9 10*3/uL (ref 0.7–3.1)
Lymphs: 35 %
Monocytes: 7 %
Neutrophils Absolute: 3 10*3/uL (ref 1.4–7.0)
WBC: 5.3 10*3/uL (ref 3.4–10.8)

## 2013-02-19 LAB — PHENYTOIN LEVEL, TOTAL: Phenytoin Lvl: 22.5 ug/mL (ref 10.0–20.0)

## 2013-02-19 NOTE — Progress Notes (Signed)
Quick Note:  I called and relayed the information re: her lab results to [name] (her caregiver). She relayed understanding of the lab dilantin level as (not a trough level). Other labs stable. No changes. ______

## 2013-03-10 DIAGNOSIS — G40909 Epilepsy, unspecified, not intractable, without status epilepticus: Secondary | ICD-10-CM | POA: Diagnosis not present

## 2013-03-13 ENCOUNTER — Other Ambulatory Visit: Payer: Self-pay | Admitting: Neurology

## 2013-03-13 DIAGNOSIS — L298 Other pruritus: Secondary | ICD-10-CM | POA: Diagnosis not present

## 2013-03-13 DIAGNOSIS — Z23 Encounter for immunization: Secondary | ICD-10-CM | POA: Diagnosis not present

## 2013-03-13 DIAGNOSIS — G809 Cerebral palsy, unspecified: Secondary | ICD-10-CM | POA: Diagnosis not present

## 2013-03-13 DIAGNOSIS — G40909 Epilepsy, unspecified, not intractable, without status epilepticus: Secondary | ICD-10-CM | POA: Diagnosis not present

## 2013-03-18 ENCOUNTER — Other Ambulatory Visit: Payer: Self-pay | Admitting: Neurology

## 2013-03-20 ENCOUNTER — Other Ambulatory Visit: Payer: Self-pay

## 2013-03-20 MED ORDER — LAMOTRIGINE 150 MG PO TABS: 150.0000 mg | ORAL_TABLET | Freq: Two times a day (BID) | ORAL | Status: DC

## 2013-03-20 NOTE — Telephone Encounter (Signed)
Patient's sister called. [facility] did not receive either of the dilantin Rx either time that it was electronically sent. I will reorder dilantin, keppra and lamictal, as pharmacy does not have any of these in their system at this point. I will send via fax. [phone]

## 2013-03-21 ENCOUNTER — Other Ambulatory Visit: Payer: Self-pay

## 2013-03-21 MED ORDER — PHENYTOIN SODIUM EXTENDED 100 MG PO CAPS: 100.0000 mg | ORAL_CAPSULE | Freq: Two times a day (BID) | ORAL | Status: DC

## 2013-03-21 NOTE — Telephone Encounter (Signed)
Patient's prescription for Lamictal for faxed to [facility] at [phone].

## 2013-03-21 NOTE — Telephone Encounter (Signed)
I was asked to call [name] about the patient's dilantin dose.  I called, spoke with [name].  She said there is No Problem with the med dose on Dilantin, they just need the Rx resent because the pharmacy states they never got the Rx we sent on 02/18/2013.  She said they have the Lamictal and Keppra already.  I have resent the Rx and called the Pharmacy.  They are not open, so I left a message rquesting they fill the medication for this patient.  Asked them to call us back with any question.  Ms [name] is aware.

## 2013-03-31 DIAGNOSIS — IMO0001 Reserved for inherently not codable concepts without codable children: Secondary | ICD-10-CM | POA: Diagnosis not present

## 2013-03-31 DIAGNOSIS — G809 Cerebral palsy, unspecified: Secondary | ICD-10-CM | POA: Diagnosis not present

## 2013-03-31 DIAGNOSIS — M6281 Muscle weakness (generalized): Secondary | ICD-10-CM | POA: Diagnosis not present

## 2013-07-11 DIAGNOSIS — I10 Essential (primary) hypertension: Secondary | ICD-10-CM | POA: Diagnosis not present

## 2013-07-11 DIAGNOSIS — G40909 Epilepsy, unspecified, not intractable, without status epilepticus: Secondary | ICD-10-CM | POA: Diagnosis not present

## 2013-07-11 DIAGNOSIS — L298 Other pruritus: Secondary | ICD-10-CM | POA: Diagnosis not present

## 2013-07-11 DIAGNOSIS — E785 Hyperlipidemia, unspecified: Secondary | ICD-10-CM | POA: Diagnosis not present

## 2013-07-11 DIAGNOSIS — K5900 Constipation, unspecified: Secondary | ICD-10-CM | POA: Diagnosis not present

## 2013-08-05 ENCOUNTER — Emergency Department (HOSPITAL_COMMUNITY): Payer: Medicare Other

## 2013-08-05 ENCOUNTER — Encounter (HOSPITAL_COMMUNITY): Payer: Self-pay | Admitting: Emergency Medicine

## 2013-08-05 ENCOUNTER — Emergency Department (HOSPITAL_COMMUNITY)
Admission: EM | Admit: 2013-08-05 | Discharge: 2013-08-06 | Disposition: A | Discharge status: Home / Self Care | Payer: Medicare Other | Attending: Emergency Medicine | Admitting: Emergency Medicine

## 2013-08-05 DIAGNOSIS — Z79899 Other long term (current) drug therapy: Secondary | ICD-10-CM | POA: Diagnosis not present

## 2013-08-05 DIAGNOSIS — I10 Essential (primary) hypertension: Secondary | ICD-10-CM | POA: Diagnosis not present

## 2013-08-05 DIAGNOSIS — R0789 Other chest pain: Secondary | ICD-10-CM | POA: Insufficient documentation

## 2013-08-05 DIAGNOSIS — G40909 Epilepsy, unspecified, not intractable, without status epilepticus: Secondary | ICD-10-CM | POA: Diagnosis not present

## 2013-08-05 DIAGNOSIS — E860 Dehydration: Secondary | ICD-10-CM

## 2013-08-05 DIAGNOSIS — R112 Nausea with vomiting, unspecified: Secondary | ICD-10-CM | POA: Diagnosis not present

## 2013-08-05 DIAGNOSIS — R079 Chest pain, unspecified: Secondary | ICD-10-CM | POA: Diagnosis not present

## 2013-08-05 HISTORY — DX: Essential (primary) hypertension: I10

## 2013-08-05 LAB — BASIC METABOLIC PANEL
BUN: 15 mg/dL (ref 6–23)
CALCIUM: 9.7 mg/dL (ref 8.4–10.5)
CO2: 28 mEq/L (ref 19–32)
CREATININE: 0.67 mg/dL (ref 0.50–1.10)
Chloride: 101 mEq/L (ref 96–112)
GLUCOSE: 95 mg/dL (ref 70–99)
POTASSIUM: 3.6 meq/L — AB (ref 3.7–5.3)
Sodium: 143 mEq/L (ref 137–147)

## 2013-08-05 LAB — URINALYSIS, ROUTINE W REFLEX MICROSCOPIC
BILIRUBIN URINE: NEGATIVE
Glucose, UA: NEGATIVE mg/dL
HGB URINE DIPSTICK: NEGATIVE
KETONES UR: NEGATIVE mg/dL
Leukocytes, UA: NEGATIVE
NITRITE: NEGATIVE
Protein, ur: NEGATIVE mg/dL
UROBILINOGEN UA: 0.2 mg/dL (ref 0.0–1.0)
pH: 5 (ref 5.0–8.0)

## 2013-08-05 LAB — CBC
HEMATOCRIT: 38.8 % (ref 36.0–46.0)
HEMOGLOBIN: 13 g/dL (ref 12.0–15.0)
MCH: 30 pg (ref 26.0–34.0)
MCHC: 33.5 g/dL (ref 30.0–36.0)
MCV: 89.4 fL (ref 78.0–100.0)
Platelets: 263 10*3/uL (ref 150–400)
RBC: 4.34 MIL/uL (ref 3.87–5.11)
RDW: 12.6 % (ref 11.5–15.5)
WBC: 4.7 10*3/uL (ref 4.0–10.5)

## 2013-08-05 LAB — PHENYTOIN LEVEL, TOTAL: Phenytoin Lvl: 16.1 ug/mL (ref 10.0–20.0)

## 2013-08-05 MED ORDER — ONDANSETRON 4 MG PO TBDP: ORAL_TABLET | ORAL | Status: DC

## 2013-08-05 NOTE — ED Notes (Signed)
Generalized weakness since last night. Vomited x 1 last night. Pt states she just does not feel well and still feels a little nauseated.

## 2013-08-05 NOTE — Discharge Instructions (Signed)
Drink plenty of fluids.  Follow up with your md in 1-2 days

## 2013-08-05 NOTE — ED Provider Notes (Signed)
CSN: [id number]     Arrival date & time 08/05/13  1636 History  This chart was scribed for Barbara LennertJoseph L Ryliegh Mcduffey, MD by Blanchard KelchNicole Curnes, ED Scribe. The patient was seen in room APA01/APA01. Patient's care was started at 8:55 PM.    Chief Complaint  Patient presents with  . Fatigue     Patient is a 58 y.o. female presenting with weakness. The history is provided by the patient. No language interpreter was used.  Weakness This is a new problem. The current episode started yesterday. The problem occurs constantly. The problem has not changed since onset.Associated symptoms include chest pain. Pertinent negatives include no abdominal pain and no headaches.    HPI Comments: [name] is a 58 y.o. female who presents to the Emergency Department complaining of constant, generalized weakness that began last night. The family states that she vomited once yesterday. She denies nausea or vomiting today. The family states she leans to the left at baseline. The family states she has been complaining of intermittent chest pain for a few months. She denies any pain currently. She has a past surgical history of a rod placed in her back. She has been unable to walk since she was [age].     PCP is Dr. Loleta ChanceHill.   Past Medical History  Diagnosis Date  . Epilepsy   . Hypertension    Past Surgical History  Procedure Laterality Date  . Back surgery    . Abdominal hysterectomy    . Arm surgery    . Breast surgery      cyst removal   No family history on file. History  Substance Use Topics  . Smoking status: Never Smoker   . Smokeless tobacco: Never Used  . Alcohol Use: No   OB History   Grav Para Term Preterm Abortions TAB SAB Ect Mult Living                 Review of Systems  Constitutional: Negative for appetite change and fatigue.  HENT: Negative for congestion, ear discharge and sinus pressure.   Eyes: Negative for discharge.  Respiratory: Negative for cough.   Cardiovascular: Positive for  chest pain.  Gastrointestinal: Positive for nausea and vomiting. Negative for abdominal pain and diarrhea.  Genitourinary: Negative for frequency and hematuria.  Musculoskeletal: Negative for back pain.  Skin: Negative for rash.  Neurological: Positive for weakness. Negative for seizures and headaches.  Psychiatric/Behavioral: Negative for hallucinations.      Allergies  Aspirin; Benadryl; Codeine; and Phenobarbital  Home Medications   Current Outpatient Rx  Name  Route  Sig  Dispense  Refill  . amLODipine (NORVASC) 5 MG tablet   Oral   Take 5 mg by mouth daily.         . carvedilol (COREG) 6.25 MG tablet   Oral   Take 6.25 mg by mouth 2 (two) times daily with a meal.         . DILANTIN 100 MG ER capsule      TAKE (1) CAPSULE BY MOUTH TWICE DAILY.   60 capsule   11   . famotidine (PEPCID) 20 MG tablet   Oral   Take 20 mg by mouth 2 (two) times daily.         . hydrochlorothiazide (HYDRODIURIL) 25 MG tablet   Oral   Take 12.5 mg by mouth daily.         Marland Kitchen. lamoTRIgine (LAMICTAL) 150 MG tablet   Oral   Take  1 tablet (150 mg total) by mouth 2 (two) times daily.   60 tablet   11   . levETIRAcetam (KEPPRA) 750 MG tablet   Oral   Take 2 tablets (1,500 mg total) by mouth every 12 (twelve) hours.   120 tablet   11   . nystatin cream (MYCOSTATIN)   Topical   Apply topically 2 (two) times daily.   30 g   1   . phenytoin (DILANTIN) 100 MG ER capsule   Oral   Take 1 capsule (100 mg total) by mouth 2 (two) times daily.   60 capsule   11   . simvastatin (ZOCOR) 10 MG tablet   Oral   Take 10 mg by mouth at bedtime.         . Skin Protectants, Misc. [facility] SKIN PROTECTANT) 50 % OINT   Apply externally   Apply topically.          Triage Vitals: BP 116/72  Pulse 76  Temp(Src) 97.9 F (36.6 C) (Oral)  Resp 18  Wt 165 lb (74.844 kg)  SpO2 97%  Physical Exam  Nursing note and vitals reviewed. Constitutional: She is oriented to person,  place, and time. She appears well-developed.  HENT:  Head: Normocephalic.  Eyes: Conjunctivae and EOM are normal. No scleral icterus.  Neck: Neck supple. No thyromegaly present.  Cardiovascular: Normal rate and regular rhythm.  Exam reveals no gallop and no friction rub.   No murmur heard. Pulmonary/Chest: No stridor. She has no wheezes. She has no rales. She exhibits no tenderness.  Abdominal: She exhibits no distension. There is no tenderness. There is no rebound.  Musculoskeletal: Normal range of motion. She exhibits no edema.  2+ edema in ankles bilaterally.   Lymphadenopathy:    She has no cervical adenopathy.  Neurological: She is oriented to person, place, and time.  Profound weakness in legs, which is baseline per family  Skin: No rash noted. No erythema.  Psychiatric: She has a normal mood and affect. Her behavior is normal.    ED Course  Procedures (including critical care time)  DIAGNOSTIC STUDIES: Oxygen Saturation is 97% on room air, adequate by my interpretation.    COORDINATION OF CARE: 9:01 PM - Patient verbalizes understanding and agrees with treatment plan.    Labs Review Labs Reviewed  BASIC METABOLIC PANEL - Abnormal; Notable for the following:    Potassium 3.6 (*)    All other components within normal limits  URINALYSIS, ROUTINE W REFLEX MICROSCOPIC - Abnormal; Notable for the following:    Specific Gravity, Urine >1.030 (*)    All other components within normal limits  CBC  PHENYTOIN LEVEL, TOTAL   Imaging Review Dg Chest Portable 1 View  08/05/2013   CLINICAL DATA:  Chest pain and fatigue.  Cerebral palsy.  EXAM: PORTABLE CHEST - 1 VIEW  COMPARISON:  12/24/2007  FINDINGS: Patient is rotated to the left. Lungs are adequately inflated and demonstrate mild prominence of the perihilar markings suggesting mild vascular congestion. There is mild stable cardiomegaly. Stabilization hardware is intact over the thoracolumbar spine. There is moderate curvature of  the spine convex to the right. There is diffuse decreased bone mineralization.  IMPRESSION: Mild prominence of the perihilar markings suggesting mild vascular congestion. Stable cardiomegaly.   Electronically Signed   By: Elberta Fortis M.D.   On: 08/05/2013 22:02     EKG Interpretation None      MDM   Final diagnoses:  None   The chart  was scribed for me under my direct supervision.  I personally performed the history, physical, and medical decision making and all procedures in the evaluation of this patient.Barbara Lennert, MD 08/05/13 2300

## 2013-11-21 DIAGNOSIS — G40909 Epilepsy, unspecified, not intractable, without status epilepticus: Secondary | ICD-10-CM | POA: Diagnosis not present

## 2013-11-21 DIAGNOSIS — I10 Essential (primary) hypertension: Secondary | ICD-10-CM | POA: Diagnosis not present

## 2013-11-21 DIAGNOSIS — G809 Cerebral palsy, unspecified: Secondary | ICD-10-CM | POA: Diagnosis not present

## 2013-11-21 DIAGNOSIS — K5900 Constipation, unspecified: Secondary | ICD-10-CM | POA: Diagnosis not present

## 2013-12-03 DIAGNOSIS — Z111 Encounter for screening for respiratory tuberculosis: Secondary | ICD-10-CM | POA: Diagnosis not present

## 2013-12-05 DIAGNOSIS — Z111 Encounter for screening for respiratory tuberculosis: Secondary | ICD-10-CM | POA: Diagnosis not present

## 2013-12-10 ENCOUNTER — Other Ambulatory Visit: Payer: Self-pay | Admitting: Neurology

## 2013-12-12 MED ORDER — LAMOTRIGINE 150 MG PO TABS: 150.0000 mg | ORAL_TABLET | Freq: Two times a day (BID) | ORAL | Status: DC

## 2014-02-18 ENCOUNTER — Ambulatory Visit (INDEPENDENT_AMBULATORY_CARE_PROVIDER_SITE_OTHER): Payer: Medicare Other | Admitting: Nurse Practitioner

## 2014-02-18 ENCOUNTER — Encounter (INDEPENDENT_AMBULATORY_CARE_PROVIDER_SITE_OTHER): Payer: Self-pay

## 2014-02-18 ENCOUNTER — Encounter: Payer: Self-pay | Admitting: Nurse Practitioner

## 2014-02-18 VITALS — BP 124/79 | HR 77

## 2014-02-18 DIAGNOSIS — G40219 Localization-related (focal) (partial) symptomatic epilepsy and epileptic syndromes with complex partial seizures, intractable, without status epilepticus: Secondary | ICD-10-CM | POA: Diagnosis not present

## 2014-02-18 DIAGNOSIS — G40901 Epilepsy, unspecified, not intractable, with status epilepticus: Secondary | ICD-10-CM | POA: Diagnosis not present

## 2014-02-18 DIAGNOSIS — Z5181 Encounter for therapeutic drug level monitoring: Secondary | ICD-10-CM

## 2014-02-18 DIAGNOSIS — G809 Cerebral palsy, unspecified: Secondary | ICD-10-CM | POA: Diagnosis not present

## 2014-02-18 DIAGNOSIS — Z23 Encounter for immunization: Secondary | ICD-10-CM | POA: Diagnosis not present

## 2014-02-18 MED ORDER — LAMOTRIGINE 150 MG PO TABS: 150.0000 mg | ORAL_TABLET | Freq: Two times a day (BID) | ORAL | Status: DC

## 2014-02-18 MED ORDER — PHENYTOIN SODIUM EXTENDED 100 MG PO CAPS: 100.0000 mg | ORAL_CAPSULE | Freq: Two times a day (BID) | ORAL | Status: DC

## 2014-02-18 MED ORDER — LEVETIRACETAM 750 MG PO TABS: 1500.0000 mg | ORAL_TABLET | Freq: Two times a day (BID) | ORAL | Status: DC

## 2014-02-18 NOTE — Patient Instructions (Signed)
Continue Dilantin, Keppra, and Lamictal at current dose, will refill Reviewed labs from 08/05/2013 ER admission. Dilantin level 16.1 Plenty of fluids to prevent dehydration Followup yearly and when necessary Next visit with Dr. Pearlean BrownieSethi

## 2014-02-18 NOTE — Progress Notes (Signed)
[facility]  PATIENT: [name] DOB: 12/14/1955   REASON FOR VISIT: follow up for seizure disorder   HISTORY OF PRESENT ILLNESS:[name] 58 -year-old female returns for followup. Her  last visit 02/18/13.She has a long standing history of cerebral palsy and seizure disorder as well as myoclonic jerks. She has not had generalized seizure in now for more than 7 years She does have occasional myoclonic jerks which are transient .She does take p.r.n. ativan which helps but takes it only when the jerking is prolonged. Patient has tried tapering and stopping Dilantin in the past without success as this led to withdrawal seizures She is troubled by left hand developing forced flexion deformity. She however denies pain and discomfort. She has not had any new health concerns since last visit except mild weight gain and being treated for respiratory infection. She had an ER visit in April for dehydration. Labs were done at that time and reviewed today. Dilantin level 16.1. She returns for reevaluation   REVIEW OF SYSTEMS: Full 14 system review of systems performed and notable only for those listed, all others are neg:  Constitutional: N/A  Cardiovascular: N/A  Ear/Nose/Throat: N/A  Skin: N/A  Eyes: N/A  Respiratory: N/A  Gastroitestinal: Constipation  Hematology/Lymphatic: N/A  Endocrine: N/A Musculoskeletal: Joint pain Allergy/Immunology: N/A  Neurological: Seizure disorder Psychiatric: N/A Sleep : NA   ALLERGIES: Allergies  Allergen Reactions  . Aspirin   . Benadryl [Diphenhydramine Hcl (Sleep)]   . Codeine   . Phenobarbital     HOME MEDICATIONS: Outpatient Prescriptions Prior to Visit  Medication Sig Dispense Refill  . amLODipine (NORVASC) 5 MG tablet Take 5 mg by mouth daily.      . carvedilol (COREG) 6.25 MG tablet Take 6.25 mg by mouth 2 (two) times daily with a meal.      . famotidine (PEPCID) 20 MG tablet Take 20 mg by mouth daily.       Marland Kitchen.  lamoTRIgine (LAMICTAL) 150 MG tablet Take 1 tablet (150 mg total) by mouth 2 (two) times daily.  60 tablet  3  . levETIRAcetam (KEPPRA) 750 MG tablet Take 1,500 mg by mouth 2 (two) times daily.      . mupirocin ointment (BACTROBAN) 2 % Place 1 application into the nose 2 (two) times daily.      Marland Kitchen. nystatin cream (MYCOSTATIN) Apply topically 2 (two) times daily.  30 g  1  . phenytoin (DILANTIN) 100 MG ER capsule Take 1 capsule (100 mg total) by mouth 2 (two) times daily.  60 capsule  11  . simvastatin (ZOCOR) 10 MG tablet Take 10 mg by mouth at bedtime.      . hydrochlorothiazide (HYDRODIURIL) 25 MG tablet Take 12.5 mg by mouth daily.      . hydrOXYzine (ATARAX/VISTARIL) 25 MG tablet Take 25 mg by mouth every 12 (twelve) hours.      . ondansetron (ZOFRAN ODT) 4 MG disintegrating tablet 4mg  ODT q4 hours prn nausea/vomit  15 tablet  0  . phenytoin (DILANTIN) 100 MG ER capsule Take 100 mg by mouth 2 (two) times daily.       No facility-administered medications prior to visit.    PAST MEDICAL HISTORY: Past Medical History  Diagnosis Date  . Epilepsy   . Hypertension     PAST SURGICAL HISTORY: Past Surgical History  Procedure Laterality Date  . Back surgery    . Abdominal hysterectomy    . Arm surgery    . Breast  surgery      cyst removal    FAMILY HISTORY: No family history on file.  SOCIAL HISTORY: History   Social History  . Marital Status: Single    Spouse Name: N/A    Number of Children: N/A  . Years of Education: N/A   Occupational History  . Not on file.   Social History Main Topics  . Smoking status: Never Smoker   . Smokeless tobacco: Never Used  . Alcohol Use: No  . Drug Use: No  . Sexual Activity: Not on file   Other Topics Concern  . Not on file   Social History Narrative  . No narrative on file     PHYSICAL EXAM  Filed Vitals:   02/18/14 1410  BP: 124/79  Pulse: 77   Cannot calculate BMI with a height equal to zero. Generalized: Well  developed,obese female in no acute distress  Head: normocephalic and atraumatic,. Oropharynx benign  Neck: Supple, no carotid bruits  Cardiac: Regular rate rhythm, no murmur  Musculoskeletal: No deformity  Neurological examination  Mentation: Alert oriented to time, place, history taking. Follows all commands, speech dysarthric.  Cranial nerve II-XII: Pupils were equal round reactive to light extraocular movements were full, visual field were full on confrontational test. Mild left facial weakness. hearing was intact to finger rubbing bilaterally. Uvula tongue midline. head turning and shoulder shrug and were normal and symmetric.Tongue protrusion into cheek strength was normal.  Motor: Spastic left hemiparesis with non-fixed left hand flexion contracture  Sensory: normal and symmetric to light touch, pinprick, and vibration  Coordination: finger-nose-finger, normal on right unable on the left.  Reflexes: Brisker on the left than right.  Gait and Station: Not ambulated , in wheelchair   DIAGNOSTIC DATA (LABS, IMAGING, TESTING) - I reviewed patient records, labs, notes, testing and imaging myself where available.  Lab Results  Component Value Date   WBC 4.7 08/05/2013   HGB 13.0 08/05/2013   HCT 38.8 08/05/2013   MCV 89.4 08/05/2013   PLT 263 08/05/2013      Component Value Date/Time   NA 143 08/05/2013 1723   NA 144 02/18/2013 1455   K 3.6* 08/05/2013 1723   CL 101 08/05/2013 1723   CO2 28 08/05/2013 1723   GLUCOSE 95 08/05/2013 1723   GLUCOSE 81 02/18/2013 1455   BUN 15 08/05/2013 1723   BUN 9 02/18/2013 1455   CREATININE 0.67 08/05/2013 1723   CALCIUM 9.7 08/05/2013 1723   PROT 6.8 02/18/2013 1455   PROT 6.8 12/11/2006 1306   ALBUMIN 3.4* 12/11/2006 1306   AST 10 02/18/2013 1455   ALT 7 02/18/2013 1455   ALKPHOS 336* 02/18/2013 1455   BILITOT 0.3 02/18/2013 1455   GFRNONAA >90 08/05/2013 1723   GFRAA >90 08/05/2013 1723    ASSESSMENT AND PLAN  58 y.o. year old female  has a past medical history  of Epilepsy  here to followup. No seizure activity in 7 years.The patient is a current patient of Dr. Pearlean BrownieSethi  who is out of the office today . This note is sent to the work in doctor.     Continue Dilantin, Keppra, and Lamictal at current dose, will refill Reviewed labs from 08/05/2013 ER admission. Dilantin level 16.1 Plenty of fluids to prevent dehydration Followup yearly and when necessary Next visit with Dr. Caswell CorwinSethi Mithra Spano Darrol Angelarolyn Nashaun Hillmer, [facility] HospitalGNP, [facility], APRN  [facility] [address], [address] [place], [place] [id number] [phone]

## 2014-02-18 NOTE — Progress Notes (Signed)
I have read the note, and I agree with the clinical assessment and plan.  [name] [name]   

## 2014-05-16 DIAGNOSIS — G40909 Epilepsy, unspecified, not intractable, without status epilepticus: Secondary | ICD-10-CM | POA: Diagnosis not present

## 2014-05-16 DIAGNOSIS — I10 Essential (primary) hypertension: Secondary | ICD-10-CM | POA: Diagnosis not present

## 2014-07-31 DIAGNOSIS — G809 Cerebral palsy, unspecified: Secondary | ICD-10-CM | POA: Diagnosis not present

## 2014-07-31 DIAGNOSIS — E785 Hyperlipidemia, unspecified: Secondary | ICD-10-CM | POA: Diagnosis not present

## 2014-07-31 DIAGNOSIS — G40804 Other epilepsy, intractable, without status epilepticus: Secondary | ICD-10-CM | POA: Diagnosis not present

## 2014-08-26 DIAGNOSIS — Z0000 Encounter for general adult medical examination without abnormal findings: Secondary | ICD-10-CM | POA: Diagnosis not present

## 2014-09-15 ENCOUNTER — Telehealth: Payer: Self-pay | Admitting: Neurology

## 2014-09-15 NOTE — Telephone Encounter (Signed)
Patient's caregiver is calling because she went to pick up the patient's Rx lamictal 150mg  and it stated "Brand name preferred but generic possible."  She was able to pick up the brand name but stated it should be note on her account she is to receive "Brand Name" drugs only in order for her insurance to pay.  Thanks!

## 2014-09-15 NOTE — Telephone Encounter (Signed)
I called back.  Caregiver said although Rx has been written for generic substitution allowed, they have been taking Brand Name Only for Lamictal, Dilantin and Keppra, and said the pharmacy has been dispensing it this way for quite some time.  [facility]  She verified they have been dispensing Brand Name, and say nothing further is needed from us at this time.  I have noted the Rx's patient requests BMN.

## 2014-09-24 DIAGNOSIS — G40804 Other epilepsy, intractable, without status epilepticus: Secondary | ICD-10-CM | POA: Diagnosis not present

## 2014-09-24 DIAGNOSIS — E785 Hyperlipidemia, unspecified: Secondary | ICD-10-CM | POA: Diagnosis not present

## 2014-09-24 DIAGNOSIS — G809 Cerebral palsy, unspecified: Secondary | ICD-10-CM | POA: Diagnosis not present

## 2014-11-20 DIAGNOSIS — I10 Essential (primary) hypertension: Secondary | ICD-10-CM | POA: Diagnosis not present

## 2014-11-20 DIAGNOSIS — G40909 Epilepsy, unspecified, not intractable, without status epilepticus: Secondary | ICD-10-CM | POA: Diagnosis not present

## 2014-11-25 DIAGNOSIS — J069 Acute upper respiratory infection, unspecified: Secondary | ICD-10-CM | POA: Diagnosis not present

## 2014-11-25 DIAGNOSIS — G40909 Epilepsy, unspecified, not intractable, without status epilepticus: Secondary | ICD-10-CM | POA: Diagnosis not present

## 2014-11-26 ENCOUNTER — Telehealth: Payer: Self-pay | Admitting: Nurse Practitioner

## 2014-11-26 NOTE — Telephone Encounter (Signed)
Reviewed CMP, liver function stable Dilantin level 21 not trough. No change currently taking  daily.

## 2015-01-08 ENCOUNTER — Other Ambulatory Visit: Payer: Self-pay | Admitting: Nurse Practitioner

## 2015-02-22 ENCOUNTER — Ambulatory Visit: Payer: Medicare Other | Admitting: Neurology

## 2015-02-23 ENCOUNTER — Ambulatory Visit (INDEPENDENT_AMBULATORY_CARE_PROVIDER_SITE_OTHER): Payer: Medicare Other | Admitting: Neurology

## 2015-02-23 ENCOUNTER — Encounter: Payer: Self-pay | Admitting: Neurology

## 2015-02-23 VITALS — BP 115/72 | HR 72 | Ht 60.0 in

## 2015-02-23 DIAGNOSIS — G40409 Other generalized epilepsy and epileptic syndromes, not intractable, without status epilepticus: Secondary | ICD-10-CM | POA: Diagnosis not present

## 2015-02-23 MED ORDER — LEVETIRACETAM 750 MG PO TABS: 1500.0000 mg | ORAL_TABLET | Freq: Two times a day (BID) | ORAL | Status: DC

## 2015-02-23 MED ORDER — LAMOTRIGINE 150 MG PO TABS: 150.0000 mg | ORAL_TABLET | Freq: Two times a day (BID) | ORAL | Status: DC

## 2015-02-23 MED ORDER — PHENYTOIN SODIUM EXTENDED 100 MG PO CAPS: 100.0000 mg | ORAL_CAPSULE | Freq: Two times a day (BID) | ORAL | Status: DC

## 2015-02-23 NOTE — Patient Instructions (Addendum)
I had a long discussion with the patient and her sister regarding her seizure disorder. She has not had a generalized tonic-clonic seizure and 8 years and I discussed briefly tapering and discontinuing Dilantin but they do not want to do so as apparently in the past this has not gone down well. She continues to have intermittent myoclonic jerks but these are not quite bothersome at the present time hence I recommend she continue lamotrigine 150 mg twice daily and Keppra 1500 mg twice daily. She was given refills for all her seizure medications to last for one year. She has an upcoming visit with her primary care physician tomorrow I advise routine basic lab work to be done. She will return for follow-up in one year with [name], nurse practitioner call earlier if necessary.

## 2015-02-23 NOTE — Progress Notes (Addendum)
[facility]  PATIENT: [name] DOB: 10/08/1955   REASON FOR VISIT: follow up for seizure disorder   HISTORY OF PRESENT ILLNESS:[name] 59 -year-old female returns for followup. Her  last visit 02/18/13.She has a long standing history of cerebral palsy and seizure disorder as well as myoclonic jerks. She has not had generalized seizure in now for more than 7 years She does have occasional myoclonic jerks which are transient .She does take p.r.n. ativan which helps but takes it only when the jerking is prolonged. Patient has tried tapering and stopping Dilantin in the past without success as this led to withdrawal seizures She is troubled by left hand developing forced flexion deformity. She however denies pain and discomfort. She has not had any new health concerns since last visit except mild weight gain and being treated for respiratory infection. She had an ER visit in April for dehydration. Labs were done at that time and reviewed today. Dilantin level 16.1. She returns for reevaluation  Update 02/23/2015 : She is seen today for routine follow-up visit after a year accompanied by her sister. She continues to do well without the generalized tonic-clonic seizure activity now for 8 years. She continues to have intermittent brief myoclonic jerks around 1 or 2 days per month. The mother notices that these are more prominent when she is constipated for a few days. She remains on Dilantin 100 mg twice daily, lamotrigine 150 mg twice daily and Keppra 1500 mg twice daily. She seems to be tolerating these medications well without any side effects. She is had no new health problems since the last visit. She does have upcoming appointment with primary care physician tomorrow and plans to get some routine lab work checked. The patient remains non-ambulant and uses electric wheelchair. REVIEW OF SYSTEMS: Full 14 system review of systems performed and notable only for those listed, all  others are neg:   Fatigue, excessive sweating, drooling, light sensitivity, eye discharge and itching, cough, wheezing, leg swelling, joint and back pain, daytime sleepiness, moles, itching, tremor, seizure,, headache, depression, nervousness or anxiety and all other systems negative  ALLERGIES: Allergies  Allergen Reactions  . Aspirin   . Benadryl [Diphenhydramine Hcl (Sleep)]   . Codeine   . Phenobarbital     HOME MEDICATIONS: Outpatient Prescriptions Prior to Visit  Medication Sig Dispense Refill  . amLODipine (NORVASC) 5 MG tablet Take 5 mg by mouth daily.    . carvedilol (COREG) 6.25 MG tablet Take 6.25 mg by mouth 2 (two) times daily with a meal.    . famotidine (PEPCID) 20 MG tablet Take 20 mg by mouth daily.     . hydrochlorothiazide (HYDRODIURIL) 25 MG tablet Take 25 mg by mouth daily. 12.5 mg daily    . mupirocin ointment (BACTROBAN) 2 % Place 1 application into the nose 2 (two) times daily.    Marland Kitchen. nystatin cream (MYCOSTATIN) Apply topically 2 (two) times daily. 30 g 1  . simvastatin (ZOCOR) 10 MG tablet Take 10 mg by mouth at bedtime.    Marland Kitchen. DILANTIN 100 MG ER capsule TAKE (1) CAPSULE BY MOUTH TWICE DAILY. 60 capsule 1  . lamoTRIgine (LAMICTAL) 150 MG tablet Take 1 tablet (150 mg total) by mouth 2 (two) times daily. (Patient taking differently: Take 150 mg by mouth 2 (two) times daily. Brand Medically Necessary) 60 tablet 11  . levETIRAcetam (KEPPRA) 750 MG tablet Take 2 tablets (1,500 mg total) by mouth 2 (two) times daily. (Patient taking differently: Take  1,500 mg by mouth 2 (two) times daily. Brand Medically Necessary) 120 tablet 11   No facility-administered medications prior to visit.    PAST MEDICAL HISTORY: Past Medical History  Diagnosis Date  . Epilepsy (HCC)   . Hypertension   . Seizures (HCC)     PAST SURGICAL HISTORY: Past Surgical History  Procedure Laterality Date  . Back surgery    . Abdominal hysterectomy    . Arm surgery    . Breast surgery       cyst removal    FAMILY HISTORY: Family History  Problem Relation Age of Onset  . Transient ischemic attack Mother     SOCIAL HISTORY: Social History   Social History  . Marital Status: Single    Spouse Name: N/A  . Number of Children: N/A  . Years of Education: N/A   Occupational History  . Not on file.   Social History Main Topics  . Smoking status: Never Smoker   . Smokeless tobacco: Never Used  . Alcohol Use: No  . Drug Use: No  . Sexual Activity: Not on file   Other Topics Concern  . Not on file   Social History Narrative     PHYSICAL EXAM  Filed Vitals:   02/23/15 1422  BP: 115/72  Pulse: 72  Height: 5' (1.524 m)   There is no weight on file to calculate BMI. Generalized:  Middle-aged African-American,obese female in no acute distress  Head: normocephalic and atraumatic,.   Neck: Supple, no carotid bruits  Cardiac: Regular rate rhythm, no murmur  Musculoskeletal: No deformity  Neurological examination  Mentation: Alert oriented to time, place, history taking. Follows all commands, speech dysarthric.  Cranial nerve II-XII: Pupils were equal round reactive to light extraocular movements were full, visual field were full on confrontational test. Mild left facial weakness. hearing was intact to finger rubbing bilaterally. Uvula tongue midline. head turning and shoulder shrug and were normal and symmetric.Tongue protrusion into cheek strength was normal.  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  Reflexes: Brisker on the left than right.  Gait and Station: Not ambulated , in wheelchair   DIAGNOSTIC DATA (LABS, IMAGING, TESTING) - I reviewed patient records, labs, notes, testing and imaging myself where available.  Lab Results  Component Value Date    WBC 4.7 08/05/2013   HGB 13.0 08/05/2013   HCT 38.8 08/05/2013   MCV 89.4 08/05/2013   PLT 263 08/05/2013      Component Value Date/Time   NA 143 08/05/2013 1723   NA 144 02/18/2013 1455   K 3.6* 08/05/2013 1723   CL 101 08/05/2013 1723   CO2 28 08/05/2013 1723   GLUCOSE 95 08/05/2013 1723   GLUCOSE 81 02/18/2013 1455   BUN 15 08/05/2013 1723   BUN 9 02/18/2013 1455   CREATININE 0.67 08/05/2013 1723   CALCIUM 9.7 08/05/2013 1723   PROT 6.8 02/18/2013 1455   PROT 6.8 12/11/2006 1306   ALBUMIN 4.1 02/18/2013 1455   ALBUMIN 3.4* 12/11/2006 1306   AST 10 02/18/2013 1455   ALT 7 02/18/2013 1455   ALKPHOS 336* 02/18/2013 1455   BILITOT 0.3 02/18/2013 1455   GFRNONAA >90 08/05/2013 1723   GFRAA >90 08/05/2013 1723    ASSESSMENT AND PLAN  59 y.o. year old female  has a past medical history of Epilepsy  here to followup. No generalizedseizure activity in 8 years but intermittent brief myoclonic jerks continue.      I had a long discussion with the patient and her sister regarding her seizure disorder. She has not had a generalized tonic-clonic seizure and 8 years and I discussed briefly tapering and discontinuing Dilantin but they do not want to do so as apparently in the past this has not gone down well. She continues to have intermittent myoclonic jerks but these are not quite bothersome at the present time hence I recommend she continue lamotrigine 150 mg twice daily and Keppra 1500 mg twice daily. She was given refills for all her seizure medications to last for one year. She has an upcoming visit with her primary care physician tomorrow I advise routine basic lab work to be done. She will return for follow-up in one year with Latrelle Dodrill, nurse practitioner call earlier if necessary. Delia Heady, MD   [facility] [address] [place], [place] [address] [phone]

## 2015-02-24 ENCOUNTER — Telehealth: Payer: Self-pay | Admitting: Neurology

## 2015-02-24 DIAGNOSIS — G40909 Epilepsy, unspecified, not intractable, without status epilepticus: Secondary | ICD-10-CM | POA: Diagnosis not present

## 2015-02-24 DIAGNOSIS — E785 Hyperlipidemia, unspecified: Secondary | ICD-10-CM | POA: Diagnosis not present

## 2015-02-24 DIAGNOSIS — Z23 Encounter for immunization: Secondary | ICD-10-CM | POA: Diagnosis not present

## 2015-02-24 DIAGNOSIS — G809 Cerebral palsy, unspecified: Secondary | ICD-10-CM | POA: Diagnosis not present

## 2015-02-24 MED ORDER — LAMOTRIGINE 150 MG PO TABS: 150.0000 mg | ORAL_TABLET | Freq: Two times a day (BID) | ORAL | Status: DC

## 2015-02-24 MED ORDER — LEVETIRACETAM 750 MG PO TABS: 1500.0000 mg | ORAL_TABLET | Freq: Two times a day (BID) | ORAL | Status: DC

## 2015-02-24 MED ORDER — DILANTIN 100 MG PO CAPS: 100.0000 mg | ORAL_CAPSULE | Freq: Two times a day (BID) | ORAL | Status: DC

## 2015-02-24 NOTE — Telephone Encounter (Signed)
Rx's have been resent for Brand Name Only. Receipt confirmed by pharmacy.  I called the pharmacy to advise they should cancel the generic Rx's.  They will cancel Rx's and did reconfirm they did receive the new one's as DAW.

## 2015-02-24 NOTE — Telephone Encounter (Signed)
Daughter [name] [phone] called to make sure that medications are sent to pharmacy as Name Brand only (no generics).

## 2015-02-24 NOTE — Telephone Encounter (Signed)
Rn call patients sister [name] who is on her DPR to let her know that the brand name was not check for the dilantin,keppra, and lamictal. Rn confirm that Shanda BumpsJessica our pharmacist did resend the meds in the brand name. Medication was e scribed and receive by pharmacy per via phone call. [name] stated she will pick up her sisters meds.

## 2015-02-24 NOTE — Telephone Encounter (Signed)
Rn return [name] call pts sister who is on her DPR list. [name] stated she wanted to make sure Dr. Pearlean BrownieSethi put the 3 seizure meds are brand names. Rn stated she will confirm with Dr. Pearlean BrownieSethi to make sure. Pts sister stated its a insurance thing, that they want to her sister generic names, even though the med is order as a brand name.

## 2015-08-12 ENCOUNTER — Other Ambulatory Visit: Payer: Self-pay | Admitting: Neurology

## 2015-08-16 DIAGNOSIS — G809 Cerebral palsy, unspecified: Secondary | ICD-10-CM | POA: Diagnosis not present

## 2015-08-16 DIAGNOSIS — E785 Hyperlipidemia, unspecified: Secondary | ICD-10-CM | POA: Diagnosis not present

## 2015-08-16 DIAGNOSIS — K5900 Constipation, unspecified: Secondary | ICD-10-CM | POA: Diagnosis not present

## 2015-08-16 DIAGNOSIS — G40909 Epilepsy, unspecified, not intractable, without status epilepticus: Secondary | ICD-10-CM | POA: Diagnosis not present

## 2015-10-15 DIAGNOSIS — L218 Other seborrheic dermatitis: Secondary | ICD-10-CM | POA: Diagnosis not present

## 2015-10-15 DIAGNOSIS — L818 Other specified disorders of pigmentation: Secondary | ICD-10-CM | POA: Diagnosis not present

## 2015-11-15 DIAGNOSIS — G809 Cerebral palsy, unspecified: Secondary | ICD-10-CM | POA: Diagnosis not present

## 2015-11-15 DIAGNOSIS — G40909 Epilepsy, unspecified, not intractable, without status epilepticus: Secondary | ICD-10-CM | POA: Diagnosis not present

## 2015-11-29 ENCOUNTER — Telehealth: Payer: Self-pay | Admitting: Neurology

## 2015-11-29 NOTE — Telephone Encounter (Signed)
Joyce/ sister called to give information to nurse: in reference to letters, she does need to have letters stating medication is medically necessary and brand name. Please call and advise when ready to pick up [phone]

## 2015-12-01 ENCOUNTER — Telehealth: Payer: Self-pay | Admitting: Neurology

## 2015-12-01 NOTE — Telephone Encounter (Signed)
Sister [name] called regarding a different issue with lamoTRIgine (LAMICTAL) 150 MG tablet, please call.

## 2015-12-01 NOTE — Telephone Encounter (Signed)
Rn call patients pharmacy and spoke with [name] about the Lamictal dosage. [name] stated the pts PCP prescribed 100mg  lamictal for patient. Dr. Pearlean Brownie prescribed 150mg  lamictal. Rn stated the patient is to have 150mg .They will cancel Dr. Loleta Chance order. Rn stated a call will be made to patients sister.

## 2015-12-01 NOTE — Telephone Encounter (Signed)
Rn spoke with patients sister that a call was made to [employer] about the keppa and lamictal. Rn stated she call and was on hold for 10 min but did not get transfer. A return call will be made today. Pts sister verbalized understanding.

## 2015-12-01 NOTE — Telephone Encounter (Signed)
Rn Pharmacist, community. Rn was transfer to customer service to coverage determination and than to pharmacy representative. Rn was on the phone for 34.35 minutes trying to give information about the patients keppra and lamictal. Rn spoke with [facility] pharmacy rept about the coverage determination. Rn explain pt has tried the generic lamictal and keppra but has failed those drugs and had seizures. Rn also explain pt needs the name brand keppra and lamictal. The coverage was approve till 11/30/2016. The reference number for lamictal is [id number] and keppra is [id number]. Rn gave fax number of [phone] to [employer] to fax over approval letter.

## 2015-12-01 NOTE — Telephone Encounter (Signed)
Rn call patients sister about the 150 Lamictal prescription.Rn stated patient has refills till 01/2016. Rn stated to sister pts PCP was prescribing meds too. Rn stated the pharmacist was spoken too and the 150 mg dose is there. Pt sister verbalized understanding.

## 2015-12-02 ENCOUNTER — Telehealth: Payer: Self-pay | Admitting: Neurology

## 2015-12-02 NOTE — Telephone Encounter (Signed)
Betsy with [facility] [employer] is calling to advise that she sent 2 faxes to our office regarding the patient and to ignore the first fax  because it is wrong.

## 2015-12-02 NOTE — Telephone Encounter (Signed)
Fax from keppra being approve.

## 2015-12-02 NOTE — Telephone Encounter (Signed)
Rn call [employer] about the approval letter for keppra name brand. Rn stated the keppra was approve under the pts prescription plan. Rn stated the lamictal was fax. Rn gave fax of [phone]. Rep stated she will fax again. To [phone].

## 2016-02-02 ENCOUNTER — Other Ambulatory Visit: Payer: Self-pay | Admitting: Neurology

## 2016-02-03 ENCOUNTER — Other Ambulatory Visit: Payer: Self-pay

## 2016-02-03 MED ORDER — DILANTIN 100 MG PO CAPS: 100.0000 mg | ORAL_CAPSULE | Freq: Two times a day (BID) | ORAL | 3 refills | Status: DC

## 2016-02-23 ENCOUNTER — Ambulatory Visit: Payer: Self-pay | Admitting: Neurology

## 2016-03-07 ENCOUNTER — Other Ambulatory Visit: Payer: Self-pay | Admitting: Neurology

## 2016-03-07 MED ORDER — KEPPRA 750 MG PO TABS: 1500.0000 mg | ORAL_TABLET | Freq: Two times a day (BID) | ORAL | 6 refills | Status: DC

## 2016-03-08 ENCOUNTER — Ambulatory Visit (INDEPENDENT_AMBULATORY_CARE_PROVIDER_SITE_OTHER): Payer: Medicare Other | Admitting: Neurology

## 2016-03-08 ENCOUNTER — Encounter: Payer: Self-pay | Admitting: Neurology

## 2016-03-08 VITALS — BP 115/71 | HR 83

## 2016-03-08 DIAGNOSIS — G40209 Localization-related (focal) (partial) symptomatic epilepsy and epileptic syndromes with complex partial seizures, not intractable, without status epilepticus: Secondary | ICD-10-CM | POA: Diagnosis not present

## 2016-03-08 NOTE — Patient Instructions (Addendum)
I had a long discussion with the patient and her sister and niece regarding her seizure disorder. She has not had a generalized tonic-clonic seizure in 9years and I discussed briefly tapering and discontinuing Dilantin but they do not want to do so as apparently in the past this has not gone down well. She continues to have very infrequent intermittent myoclonic jerks but these are not quite bothersome at the present time hence I recommend she continue lamotrigine 150 mg twice daily and Keppra 1500 mg twice daily. Plan to check CMP, CBC and Dilantin level today. She will return for follow-up in one year with Latrelle Dodrillaroline Martin, nurse practitioner call earlier if necessary.

## 2016-03-08 NOTE — Progress Notes (Signed)
[facility]  PATIENT: Barbara ChampionLeah G Popp DOB: 03/28/1956   REASON FOR VISIT: follow up for seizure disorder   HISTORY OF PRESENT ILLNESS:Ms Pfeffer 60 -year-old female returns for followup. Her  last visit 02/18/13.She has a long standing history of cerebral palsy and seizure disorder as well as myoclonic jerks. She has not had generalized seizure in now for more than 7 years She does have occasional myoclonic jerks which are transient .She does take p.r.n. ativan which helps but takes it only when the jerking is prolonged. Patient has tried tapering and stopping Dilantin in the past without success as this led to withdrawal seizures She is troubled by left hand developing forced flexion deformity. She however denies pain and discomfort. She has not had any new health concerns since last visit except mild weight gain and being treated for respiratory infection. She had an ER visit in April for dehydration. Labs were done at that time and reviewed today. Dilantin level 16.1. She returns for reevaluation  Update 02/23/2015 : She is seen today for routine follow-up visit after a year accompanied by her sister. She continues to do well without the generalized tonic-clonic seizure activity now for 8 years. She continues to have intermittent brief myoclonic jerks around 1 or 2 days per month. The mother notices that these are more prominent when she is constipated for a few days. She remains on Dilantin 100 mg twice daily, lamotrigine 150 mg twice daily and Keppra 1500 mg twice daily. She seems to be tolerating these medications well without any side effects. She is had no new health problems since the last visit. She does have upcoming appointment with primary care physician tomorrow and plans to get some routine lab work checked. The patient remains non-ambulant and uses electric wheelchair. Update 03/08/2016 ; she returns for follow-up after last visit a year ago. She is accompanied by his  sister and niece. She continues to do well and has not had a seizure now for 9 years. She remains on Dilantin 200 mg a day, lamotrigine 150 mg twice daily and Keppra 1500 mg twice daily. She is tolerating these medications well without any side effects. She's had no new interval health problems or hospitalizations. She has not had any recent lab work done by her primary physician either. Patient does not keen to taper and reduce her medications. She continues to have a occasional intermittent myoclonic jerks but these are not bothersome. REVIEW OF SYSTEMS: Full 14 system review of systems performed and notable only for those listed, all others are neg:     leg swelling, j  seizure,, dysarthria and all other systems negative  ALLERGIES: Allergies  Allergen Reactions  . Aspirin   . Benadryl [Diphenhydramine Hcl (Sleep)]   . Codeine   . Phenobarbital     HOME MEDICATIONS: Outpatient Medications Prior to Visit  Medication Sig Dispense Refill  . amLODipine (NORVASC) 5 MG tablet Take 5 mg by mouth daily.    . carvedilol (COREG) 6.25 MG tablet Take 6.25 mg by mouth 2 (two) times daily with a meal.    . DILANTIN 100 MG ER capsule Take 1 capsule (100 mg total) by mouth 2 (two) times daily. 180 capsule 3  . famotidine (PEPCID) 20 MG tablet Take 20 mg by mouth daily.     . hydrochlorothiazide (HYDRODIURIL) 25 MG tablet Take 25 mg by mouth daily. 12.5 mg daily    . hydrOXYzine (ATARAX/VISTARIL) 25 MG tablet     . KEPPRA  750 MG tablet Take 2 tablets (1,500 mg total) by mouth 2 (two) times daily. 120 tablet 6  . lamoTRIgine (LAMICTAL) 150 MG tablet Take 1 tablet (150 mg total) by mouth 2 (two) times daily. 180 tablet 3  . mupirocin ointment (BACTROBAN) 2 % Place 1 application into the nose 2 (two) times daily.    Marland Kitchen. nystatin cream (MYCOSTATIN) Apply topically 2 (two) times daily. 30 g 1  . simvastatin (ZOCOR) 10 MG tablet Take 10 mg by mouth at bedtime.     No facility-administered medications prior to  visit.     PAST MEDICAL HISTORY: Past Medical History:  Diagnosis Date  . Epilepsy (HCC)   . Hypertension   . Seizures (HCC)     PAST SURGICAL HISTORY: Past Surgical History:  Procedure Laterality Date  . ABDOMINAL HYSTERECTOMY    . arm surgery    . BACK SURGERY    . BREAST SURGERY     cyst removal    FAMILY HISTORY: Family History  Problem Relation Age of Onset  . Transient ischemic attack Mother     SOCIAL HISTORY: Social History   Social History  . Marital status: Single    Spouse name: N/A  . Number of children: N/A  . Years of education: N/A   Occupational History  . Not on file.   Social History Main Topics  . Smoking status: Never Smoker  . Smokeless tobacco: Never Used  . Alcohol use No  . Drug use: No  . Sexual activity: Not on file   Other Topics Concern  . Not on file   Social History Narrative  . No narrative on file     PHYSICAL EXAM  Vitals:   03/08/16 1117  BP: 115/71  BP Location: Right Arm  Patient Position: Sitting  Pulse: 83   There is no height or weight on file to calculate BMI. Generalized:  Middle-aged African-American,obese female in no acute distress  Head: normocephalic and atraumatic,.   Neck: Supple, no carotid bruits  Cardiac: Regular rate rhythm, no murmur  Musculoskeletal: severe scoliosis to left Neurological examination  Mentation: Alert oriented to time, place, history taking. Follows all commands, speech  Severely dysarthric.  Cranial nerve II-XII: Pupils were equal round reactive to light extraocular movements were full, visual field were full on confrontational test. Mild left facial weakness. hearing was intact to finger rubbing bilaterally. Uvula tongue midline. head turning and shoulder shrug and were normal and symmetric.Tongue protrusion into cheek strength was normal.  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 Good right sided  strength Sensory: normal and symmetric to light touch, pinprick, and vibration  Coordination: finger-nose-finger, normal on right unable on the left.  Reflexes: Brisker on the left than right.  Gait and Station: Not ambulated , in wheelchair   DIAGNOSTIC DATA (LABS, IMAGING, TESTING) - I reviewed patient records, labs, notes, testing and imaging myself where available.  Lab Results  Component Value Date   WBC 4.7 08/05/2013   HGB 13.0 08/05/2013   HCT 38.8 08/05/2013   MCV 89.4 08/05/2013   PLT 263 08/05/2013      Component Value Date/Time   NA 143 08/05/2013 1723   NA 144 02/18/2013 1455   K 3.6 (L) 08/05/2013 1723   CL 101 08/05/2013 1723   CO2 28 08/05/2013 1723   GLUCOSE 95 08/05/2013 1723   BUN 15 08/05/2013 1723   BUN 9 02/18/2013 1455   CREATININE 0.67 08/05/2013 1723  CALCIUM 9.7 08/05/2013 1723   PROT 6.8 02/18/2013 1455   ALBUMIN 4.1 02/18/2013 1455   AST 10 02/18/2013 1455   ALT 7 02/18/2013 1455   ALKPHOS 336 (H) 02/18/2013 1455   BILITOT 0.3 02/18/2013 1455   GFRNONAA >90 08/05/2013 1723   GFRAA >90 08/05/2013 1723    ASSESSMENT AND PLAN  60 y.o. year old female  has a past medical history of Epilepsy  here to followup. No generalizedseizure activity in 8 years but intermittent brief myoclonic jerks continue.      I had a long discussion with the patient and her sister and niece regarding her seizure disorder. She has not had a generalized tonic-clonic seizure in 9years and I discussed briefly tapering and discontinuing Dilantin but they do not want to do so as apparently in the past this has not gone down well. She continues to have very infrequent intermittent myoclonic jerks but these are not quite bothersome at the present time hence I recommend she continue lamotrigine 150 mg twice daily and Keppra 1500 mg twice daily. Plan to check CMP, CBC and Dilantin level today. She will return for follow-up in one year with Latrelle Dodrill, nurse practitioner call  earlier if necessary.y. Delia Heady, MD   [facility] [address], [address] [place], [place] [address] [phone]

## 2016-03-09 LAB — CBC
HEMATOCRIT: 34.4 % (ref 34.0–46.6)
Hemoglobin: 11.9 g/dL (ref 11.1–15.9)
MCH: 29.5 pg (ref 26.6–33.0)
MCHC: 34.6 g/dL (ref 31.5–35.7)
MCV: 85 fL (ref 79–97)
Platelets: 254 10*3/uL (ref 150–379)
RBC: 4.03 x10E6/uL (ref 3.77–5.28)
RDW: 12.8 % (ref 12.3–15.4)
WBC: 5.1 10*3/uL (ref 3.4–10.8)

## 2016-03-09 LAB — COMPREHENSIVE METABOLIC PANEL
ALT: 6 IU/L (ref 0–32)
AST: 11 IU/L (ref 0–40)
Albumin/Globulin Ratio: 1.3 (ref 1.2–2.2)
Albumin: 4.1 g/dL (ref 3.5–5.5)
Alkaline Phosphatase: 302 IU/L — ABNORMAL HIGH (ref 39–117)
BUN/Creatinine Ratio: 18 (ref 9–23)
BUN: 14 mg/dL (ref 6–24)
Bilirubin Total: 0.2 mg/dL (ref 0.0–1.2)
CALCIUM: 9.3 mg/dL (ref 8.7–10.2)
CO2: 27 mmol/L (ref 18–29)
CREATININE: 0.78 mg/dL (ref 0.57–1.00)
Chloride: 102 mmol/L (ref 96–106)
GFR calc non Af Amer: 83 mL/min/{1.73_m2} (ref >59)
GFR, EST AFRICAN AMERICAN: 96 mL/min/{1.73_m2} (ref >59)
GLOBULIN, TOTAL: 3.1 g/dL (ref 1.5–4.5)
Glucose: 84 mg/dL (ref 65–99)
Potassium: 4.2 mmol/L (ref 3.5–5.2)
Sodium: 144 mmol/L (ref 134–144)
TOTAL PROTEIN: 7.2 g/dL (ref 6.0–8.5)

## 2016-03-09 LAB — PHENYTOIN LEVEL, TOTAL: Phenytoin (Dilantin), Serum: 25.4 ug/mL (ref 10.0–20.0)

## 2016-03-15 DIAGNOSIS — E7800 Pure hypercholesterolemia, unspecified: Secondary | ICD-10-CM | POA: Diagnosis not present

## 2016-03-15 DIAGNOSIS — G40909 Epilepsy, unspecified, not intractable, without status epilepticus: Secondary | ICD-10-CM | POA: Diagnosis not present

## 2016-03-15 DIAGNOSIS — G809 Cerebral palsy, unspecified: Secondary | ICD-10-CM | POA: Diagnosis not present

## 2016-03-15 DIAGNOSIS — I10 Essential (primary) hypertension: Secondary | ICD-10-CM | POA: Diagnosis not present

## 2016-03-15 DIAGNOSIS — Z79899 Other long term (current) drug therapy: Secondary | ICD-10-CM | POA: Diagnosis not present

## 2016-03-15 DIAGNOSIS — Z888 Allergy status to other drugs, medicaments and biological substances status: Secondary | ICD-10-CM | POA: Diagnosis not present

## 2016-03-15 DIAGNOSIS — L89312 Pressure ulcer of right buttock, stage 2: Secondary | ICD-10-CM | POA: Diagnosis not present

## 2016-03-15 DIAGNOSIS — K219 Gastro-esophageal reflux disease without esophagitis: Secondary | ICD-10-CM | POA: Diagnosis not present

## 2016-03-15 DIAGNOSIS — Z886 Allergy status to analgesic agent status: Secondary | ICD-10-CM | POA: Diagnosis not present

## 2016-03-15 DIAGNOSIS — Z885 Allergy status to narcotic agent status: Secondary | ICD-10-CM | POA: Diagnosis not present

## 2016-03-21 ENCOUNTER — Telehealth: Payer: Self-pay

## 2016-03-21 NOTE — Telephone Encounter (Signed)
-----   Message from Micki RileyPramod S Sethi, MD sent at 03/21/2016  9:08 AM EST ----- Kindly inform the patient that phenytoin level is elevated but in the last clinical visit 03/08/16 patient did not have clinical signs of toxicity and she is only on Dilantin 100 mg twice daily in the past she had withdrawal seizures  when Dilantin dose was reduced further and family is reluctant to do so hence we will leave the dose unchanged for now.

## 2016-03-21 NOTE — Telephone Encounter (Signed)
Rn call patients sister [name] on dpr form. Rn stated the phenytoin level is elevated but in the last clinical visit 03/08/16 patient did not have clinical signs of toxicity and she is only on Dilantin 100 mg twice daily in the past she had withdrawal seizures  when Dilantin dose was reduced further and family is reluctant to do so hence we will leave the dose unchanged for now. PTs sister it was discuss at last office visit about not reducing the medication, because it will cause her to have a seizure. Rn gave sister toxicity levels on what to look for. Pt will remain on current dosage.

## 2016-03-21 NOTE — Telephone Encounter (Signed)
LFt  vm for patients sister [name]. Rn left message to call back about lab work.

## 2016-03-28 ENCOUNTER — Other Ambulatory Visit: Payer: Self-pay | Admitting: Neurology

## 2016-03-29 ENCOUNTER — Other Ambulatory Visit: Payer: Self-pay

## 2016-03-29 MED ORDER — LAMOTRIGINE 150 MG PO TABS: 150.0000 mg | ORAL_TABLET | Freq: Two times a day (BID) | ORAL | 3 refills | Status: DC

## 2016-10-03 ENCOUNTER — Other Ambulatory Visit: Payer: Self-pay | Admitting: Neurology

## 2016-10-31 ENCOUNTER — Other Ambulatory Visit: Payer: Self-pay | Admitting: Neurology

## 2016-12-05 ENCOUNTER — Telehealth: Payer: Self-pay | Admitting: Neurology

## 2016-12-05 NOTE — Telephone Encounter (Signed)
Patients sister [name] (listed on DPR) called office in reference KEPPRA 750 MG tablet stating patients insurance is needing a new prior authorization being the one from last year expired on 11/30/16.  [name] states patient has maybe 3 days left of the medication.  Pharmacy- [facility].

## 2016-12-05 NOTE — Telephone Encounter (Signed)
If patients sister call back the PA has already been approve. It was done already. She needs to call the pharmacy. It was also approve. Letter from [employer] states it was approve.  PA approve for keppra from till 12/04/2017 brand name. Rn call patients pharmacy [facility] and the it can be refilled anytime.

## 2016-12-18 DIAGNOSIS — G809 Cerebral palsy, unspecified: Secondary | ICD-10-CM | POA: Diagnosis not present

## 2016-12-18 DIAGNOSIS — G40909 Epilepsy, unspecified, not intractable, without status epilepticus: Secondary | ICD-10-CM | POA: Diagnosis not present

## 2016-12-18 DIAGNOSIS — E785 Hyperlipidemia, unspecified: Secondary | ICD-10-CM | POA: Diagnosis not present

## 2016-12-25 ENCOUNTER — Telehealth: Payer: Self-pay | Admitting: Neurology

## 2016-12-25 NOTE — Telephone Encounter (Signed)
Rn call pts pharmacy. Rn spoke with pharmacist about sister stating dilantin/lamictal needed a PA. The staff stated only the lamictal is requiring PA. The dilantin is not requiring a PA. He check the dilantin ER and it was stated no PA, it only said 0 co payment for patient. RN verbalized understanding.

## 2016-12-25 NOTE — Telephone Encounter (Signed)
Patient's sister calling to get PA for medications lamoTRIgine (LAMICTAL) 150 MG tablet and DILANTIN 100 MG ER capsule. Patient's pharmacy is [facility].

## 2016-12-26 NOTE — Telephone Encounter (Signed)
Rn call sister that the lamictal PA was approve till 12/26/2017.Rn stated pts refill are good till 03/2017 for lamictal,and 01/2017 for dilantin. NO PA is needed for dilantin. Pts sister verbalized understanding.

## 2016-12-26 NOTE — Telephone Encounter (Signed)
Rn did lamictal PA on cover my meds.  Rn receive form from [employer] [facility] at [phone]. Rn spoke with Liz Beach about pt needing brand name only non formulary. Rn answers the questions about the pt reasoning for lamictal for seizures. Rn stated pt has failed generic keppra, and lamictal, and continue to have seizures while on the generic medication. Rn stated pt has been on brand name lamictal since 2015,and has done well with no seizure activity. Lamictal was approve for brand name non formulary 09/27/2016 to 12/26/2017 PA. Fax will be sent to [phone]. Liz Beach stated the paper non formulary that was fax does not need to be fax to them.

## 2016-12-26 NOTE — Telephone Encounter (Signed)
Pt's sister called said the pt is out of the lamictal. She wants to know if PA has been started. She is also concerned dilantin will need PA. I advised her RN said it does not per pharmacist and PA could not be started if it was not needed. Please Joyce/[phone] is requesting RN to call her for status update.

## 2017-01-12 DIAGNOSIS — G40909 Epilepsy, unspecified, not intractable, without status epilepticus: Secondary | ICD-10-CM | POA: Diagnosis not present

## 2017-01-12 DIAGNOSIS — G809 Cerebral palsy, unspecified: Secondary | ICD-10-CM | POA: Diagnosis not present

## 2017-01-12 DIAGNOSIS — E785 Hyperlipidemia, unspecified: Secondary | ICD-10-CM | POA: Diagnosis not present

## 2017-02-05 ENCOUNTER — Other Ambulatory Visit: Payer: Self-pay | Admitting: Neurology

## 2017-02-21 ENCOUNTER — Other Ambulatory Visit: Payer: Self-pay | Admitting: Neurology

## 2017-03-09 ENCOUNTER — Ambulatory Visit: Payer: Self-pay | Admitting: Nurse Practitioner

## 2017-03-14 ENCOUNTER — Ambulatory Visit: Payer: Self-pay | Admitting: Nurse Practitioner

## 2017-04-02 DIAGNOSIS — J069 Acute upper respiratory infection, unspecified: Secondary | ICD-10-CM | POA: Diagnosis not present

## 2017-05-28 NOTE — Progress Notes (Signed)
[facility]  PATIENT: [name] DOB: May 17, 1955   REASON FOR VISIT: Follow-up for myoclonus jerks, cerebral palsy and seizure disorder HISTORY FROM: Patient and sister    HISTORY OF PRESENT ILLNESS: [name] 62 -year-old female returns for followup. Her  last visit 02/18/13.She has a long standing history of cerebral palsy and seizure disorder as well as myoclonic jerks. She has not had generalized seizure in now for more than 7 years She does have occasional myoclonic jerks which are transient .She does take p.r.n. ativan which helps but takes it only when the jerking is prolonged. Patient has tried tapering and stopping Dilantin in the past without success as this led to withdrawal seizures She is troubled by left hand developing forced flexion deformity. She however denies pain and discomfort. She has not had any new health concerns since last visit except mild weight gain and being treated for respiratory infection. She had an ER visit in April for dehydration. Labs were done at that time and reviewed today. Dilantin level 16.1. She returns for reevaluation  Update 02/23/2015 : She is seen today for routine follow-up visit after a year accompanied by her sister. She continues to do well without the generalized tonic-clonic seizure activity now for 8 years. She continues to have intermittent brief myoclonic jerks around 1 or 2 days per month. The mother notices that these are more prominent when she is constipated for a few days. She remains on Dilantin 100 mg twice daily, lamotrigine 150 mg twice daily and Keppra 1500 mg twice daily. She seems to be tolerating these medications well without any side effects. She is had no new health problems since the last visit. She does have upcoming appointment with primary care physician tomorrow and plans to get some routine lab work checked. The patient remains non-ambulant and uses electric wheelchair. Update 11/8/2017PS ; she  returns for follow-up after last visit a year ago. She is accompanied by his sister and niece. She continues to do well and has not had a seizure now for 9 years. She remains on Dilantin 200 mg a day, lamotrigine 150 mg twice daily and Keppra 1500 mg twice daily. She is tolerating these medications well without any side effects. She's had no new interval health problems or hospitalizations. She has not had any recent lab work done by her primary physician either. Patient does not keen to taper and reduce her medications. She continues to have a occasional intermittent myoclonic jerks but these are not bothersome UPDATE 1/29/2019CM  [name], 62 year old female returns for follow-up accompanied by her sister and brother.  She continues to do well with no seizure events in over 10 years.  She continues to have some occasional intermittent myoclonic jerks but these are not bothersome.  She remains on Dilantin 200 day, Lamictal 150 twice daily and Keppra 1500 twice daily.  She does not have side effects to her medications.  She has not had  lab work in the last year.  She has not had interval medical issues.  She returns for reevaluation REVIEW OF SYSTEMS: Full 14 system review of systems performed and notable only for those listed, all others are neg:  Constitutional: neg  Cardiovascular: neg Ear/Nose/Throat: neg  Skin: neg Eyes: neg Respiratory: neg Gastroitestinal: neg  Hematology/Lymphatic: neg  Endocrine: neg Musculoskeletal: Wheelchair-bound Allergy/Immunology: neg Neurological: History of seizure disorder, myoclonic jerks cerebral palsy Psychiatric: neg Sleep : neg   ALLERGIES: Allergies  Allergen Reactions  . Aspirin   .  Benadryl [Diphenhydramine Hcl (Sleep)]   . Codeine   . Phenobarbital     HOME MEDICATIONS: Outpatient Medications Prior to Visit  Medication Sig Dispense Refill  . amLODipine (NORVASC) 5 MG tablet Take 5 mg by mouth daily.    . carvedilol (COREG) 6.25 MG tablet  Take 6.25 mg by mouth 2 (two) times daily with a meal.    . DILANTIN 100 MG ER capsule TAKE (1) CAPSULE BY MOUTH TWICE DAILY. 180 capsule 3  . famotidine (PEPCID) 20 MG tablet Take 20 mg by mouth daily.     . hydrochlorothiazide (HYDRODIURIL) 25 MG tablet Take 25 mg by mouth daily. 12.5 mg daily    . hydrOXYzine (ATARAX/VISTARIL) 25 MG tablet     . KEPPRA 750 MG tablet TAKE 2 TABLETS BY MOUTH 2 TIMES DAILY. 360 tablet 3  . LAMICTAL 150 MG tablet TAKE 1 TABLET BY MOUTH TWICE DAILY. 60 tablet 12  . mupirocin ointment (BACTROBAN) 2 % Place 1 application into the nose 2 (two) times daily.    Marland Kitchen nystatin cream (MYCOSTATIN) Apply topically 2 (two) times daily. 30 g 1  . simvastatin (ZOCOR) 10 MG tablet Take 10 mg by mouth at bedtime.    . betamethasone, augmented, (DIPROLENE) 0.05 % lotion Apply topically 2 (two) times daily.     No facility-administered medications prior to visit.     PAST MEDICAL HISTORY: Past Medical History:  Diagnosis Date  . Epilepsy (HCC)   . Hypertension   . Seizures (HCC)     PAST SURGICAL HISTORY: Past Surgical History:  Procedure Laterality Date  . ABDOMINAL HYSTERECTOMY    . arm surgery    . BACK SURGERY    . BREAST SURGERY     cyst removal    FAMILY HISTORY: Family History  Problem Relation Age of Onset  . Transient ischemic attack Mother     SOCIAL HISTORY: Social History   Socioeconomic History  . Marital status: Single    Spouse name: Not on file  . Number of children: Not on file  . Years of education: Not on file  . Highest education level: Not on file  Social Needs  . Financial resource strain: Not on file  . Food insecurity - worry: Not on file  . Food insecurity - inability: Not on file  . Transportation needs - medical: Not on file  . Transportation needs - non-medical: Not on file  Occupational History  . Not on file  Tobacco Use  . Smoking status: Never Smoker  . Smokeless tobacco: Never Used  Substance and Sexual Activity    . Alcohol use: No  . Drug use: No  . Sexual activity: Not on file  Other Topics Concern  . Not on file  Social History Narrative  . Not on file     PHYSICAL EXAM  Vitals:   05/29/17 1239  BP: 121/72  Pulse: 75   There is no height or weight on file to calculate BMI.  Generalized: Well developed, obese female in no acute distress  Head: normocephalic and atraumatic,. Oropharynx benign  Neck: Supple,  Musculoskeletal: Severe scoliosis to the left  Neurological examination   Mentation: Alert severely dysarthric follows all commands    Cranial nerve II-XII: Pupils were equal round reactive to light extraocular movements were full, visual field were full on confrontational test.  Mild left facial weakness . hearing was intact to finger rubbing bilaterally. Uvula tongue midline. head turning and shoulder shrug were normal and symmetric.Tongue protrusion  into cheek strength was normal. 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  Good strength on the right side  Sensory: normal and symmetric to light touch, pinprick, and  Vibration in the upper and lower extremity  Coordination: finger-nose-finger, normal on the right  unable to perform on the left Reflexes: Brisker on the left and right Gait and Station: Wheelchair-bound not ambulated DIAGNOSTIC DATA (LABS, IMAGING, TESTING) - I reviewed patient records, labs, notes, testing and imaging myself where available.  Lab Results  Component Value Date   WBC 5.1 03/08/2016   HGB 11.9 03/08/2016   HCT 34.4 03/08/2016   MCV 85 03/08/2016   PLT 254 03/08/2016      Component Value Date/Time   NA 144 03/08/2016 1256   K 4.2 03/08/2016 1256   CL 102 03/08/2016 1256   CO2 27 03/08/2016 1256   GLUCOSE 84 03/08/2016 1256   GLUCOSE 95 08/05/2013 1723   BUN 14 03/08/2016 1256   CREATININE 0.78 03/08/2016 1256   CALCIUM 9.3 03/08/2016 1256   PROT 7.2 03/08/2016 1256    ALBUMIN 4.1 03/08/2016 1256   AST 11 03/08/2016 1256   ALT 6 03/08/2016 1256   ALKPHOS 302 (H) 03/08/2016 1256   BILITOT 0.2 03/08/2016 1256   GFRNONAA 83 03/08/2016 1256   GFRAA 96 03/08/2016 1256        ASSESSMENT AND PLAN 62 y.o. year old female  has a past medical history of Epilepsy  here to followup. No generalized seizure activity in 10years but intermittent brief myoclonic jerks continue.      Continue Dilantin, Keppra, and Lamictal at current doses (family does not wish to taper Dilantin) We will check Dilantin level for therapeutic level CBC and CMP to monitor adverse effects of Dilantin Follow-up yearly and as needed Nilda RiggsNancy Carolyn Darian Ace, [facility] HospitalGNP, [facility] CenterBC, APRN  [facility] [address], [address] [place], [place] [address] [phone]

## 2017-05-29 ENCOUNTER — Encounter: Payer: Self-pay | Admitting: Nurse Practitioner

## 2017-05-29 ENCOUNTER — Ambulatory Visit (INDEPENDENT_AMBULATORY_CARE_PROVIDER_SITE_OTHER): Payer: Medicare Other | Admitting: Nurse Practitioner

## 2017-05-29 VITALS — BP 121/72 | HR 75

## 2017-05-29 DIAGNOSIS — Z5181 Encounter for therapeutic drug level monitoring: Secondary | ICD-10-CM | POA: Diagnosis not present

## 2017-05-29 DIAGNOSIS — G809 Cerebral palsy, unspecified: Secondary | ICD-10-CM

## 2017-05-29 DIAGNOSIS — G40219 Localization-related (focal) (partial) symptomatic epilepsy and epileptic syndromes with complex partial seizures, intractable, without status epilepticus: Secondary | ICD-10-CM | POA: Diagnosis not present

## 2017-05-29 NOTE — Patient Instructions (Signed)
Continue Dilantin, Keppra, and Lamictal at current doses We will check Dilantin level CBC and CMP Follow-up yearly and as needed

## 2017-05-30 ENCOUNTER — Telehealth: Payer: Self-pay | Admitting: *Deleted

## 2017-05-30 LAB — CBC WITH DIFFERENTIAL/PLATELET
BASOS: 0 %
Basophils Absolute: 0 10*3/uL (ref 0.0–0.2)
EOS (ABSOLUTE): 0 10*3/uL (ref 0.0–0.4)
EOS: 1 %
HEMATOCRIT: 35.2 % (ref 34.0–46.6)
HEMOGLOBIN: 12.2 g/dL (ref 11.1–15.9)
Immature Grans (Abs): 0 10*3/uL (ref 0.0–0.1)
Immature Granulocytes: 0 %
Lymphocytes Absolute: 1.8 10*3/uL (ref 0.7–3.1)
Lymphs: 30 %
MCH: 29.9 pg (ref 26.6–33.0)
MCHC: 34.7 g/dL (ref 31.5–35.7)
MCV: 86 fL (ref 79–97)
MONOCYTES: 9 %
Monocytes Absolute: 0.5 10*3/uL (ref 0.1–0.9)
NEUTROS ABS: 3.6 10*3/uL (ref 1.4–7.0)
Neutrophils: 60 %
Platelets: 262 10*3/uL (ref 150–379)
RBC: 4.08 x10E6/uL (ref 3.77–5.28)
RDW: 13.6 % (ref 12.3–15.4)
WBC: 5.9 10*3/uL (ref 3.4–10.8)

## 2017-05-30 LAB — COMPREHENSIVE METABOLIC PANEL
ALT: 10 IU/L (ref 0–32)
AST: 16 IU/L (ref 0–40)
Albumin/Globulin Ratio: 1.4 (ref 1.2–2.2)
Albumin: 4.3 g/dL (ref 3.6–4.8)
Alkaline Phosphatase: 380 IU/L — ABNORMAL HIGH (ref 39–117)
BILIRUBIN TOTAL: 0.2 mg/dL (ref 0.0–1.2)
BUN/Creatinine Ratio: 14 (ref 12–28)
BUN: 9 mg/dL (ref 8–27)
CHLORIDE: 102 mmol/L (ref 96–106)
CO2: 26 mmol/L (ref 20–29)
Calcium: 9.3 mg/dL (ref 8.7–10.3)
Creatinine, Ser: 0.64 mg/dL (ref 0.57–1.00)
GFR calc Af Amer: 112 mL/min/{1.73_m2} (ref >59)
GFR calc non Af Amer: 97 mL/min/{1.73_m2} (ref >59)
GLUCOSE: 93 mg/dL (ref 65–99)
Globulin, Total: 3.1 g/dL (ref 1.5–4.5)
POTASSIUM: 3.4 mmol/L — AB (ref 3.5–5.2)
Sodium: 148 mmol/L — ABNORMAL HIGH (ref 134–144)
Total Protein: 7.4 g/dL (ref 6.0–8.5)

## 2017-05-30 LAB — PHENYTOIN LEVEL, TOTAL: Phenytoin (Dilantin), Serum: 28.9 ug/mL (ref 10.0–20.0)

## 2017-05-30 NOTE — Telephone Encounter (Signed)
LVM for sister, [name] on [place] requesting call back.

## 2017-05-31 NOTE — Telephone Encounter (Signed)
Pt's sister returned RN's call. Please call

## 2017-05-31 NOTE — Telephone Encounter (Signed)
I called [name].  Gave her the results of the labs. Dilantin level elevated.  CM/NP recommends to decrease to 100mg  po daily if sister ok to do that.  [name] states that she does not feel comfortable decreasing dose of her dilantin which she takes now 100mg  po bid, even with elevated level as she had sz in past from decrease.    She has had elevated level in past.  She is not showing signs of toxicity (or any changes in her normal baseline).  If wants to get another level down the line please let her know.

## 2017-05-31 NOTE — Telephone Encounter (Signed)
Noted  

## 2017-08-18 ENCOUNTER — Emergency Department (HOSPITAL_COMMUNITY)
Admission: EM | Admit: 2017-08-18 | Discharge: 2017-08-18 | Disposition: A | Discharge status: Home / Self Care | Payer: Medicare Other | Attending: Emergency Medicine | Admitting: Emergency Medicine

## 2017-08-18 ENCOUNTER — Emergency Department (HOSPITAL_COMMUNITY): Payer: Medicare Other

## 2017-08-18 ENCOUNTER — Encounter (HOSPITAL_COMMUNITY): Payer: Self-pay | Admitting: Emergency Medicine

## 2017-08-18 ENCOUNTER — Other Ambulatory Visit: Payer: Self-pay

## 2017-08-18 DIAGNOSIS — M25561 Pain in right knee: Secondary | ICD-10-CM | POA: Diagnosis not present

## 2017-08-18 DIAGNOSIS — I10 Essential (primary) hypertension: Secondary | ICD-10-CM | POA: Diagnosis not present

## 2017-08-18 DIAGNOSIS — Z79899 Other long term (current) drug therapy: Secondary | ICD-10-CM | POA: Insufficient documentation

## 2017-08-18 DIAGNOSIS — R52 Pain, unspecified: Secondary | ICD-10-CM

## 2017-08-18 DIAGNOSIS — M25461 Effusion, right knee: Secondary | ICD-10-CM | POA: Diagnosis not present

## 2017-08-18 DIAGNOSIS — G809 Cerebral palsy, unspecified: Secondary | ICD-10-CM | POA: Diagnosis not present

## 2017-08-18 DIAGNOSIS — M7989 Other specified soft tissue disorders: Secondary | ICD-10-CM | POA: Diagnosis not present

## 2017-08-18 NOTE — ED Provider Notes (Signed)
[facility] EMERGENCY DEPARTMENT Provider Note   CSN: [id number] Arrival date & time: 08/18/17  1038     History   Chief Complaint Chief Complaint  Patient presents with  . Leg Pain    HPI [name] is a 62 y.o. female.  HPI  Patient is a 62 year old female with a history of cerebral palsy, epilepsy, hypertension, hyperlipidemia presenting for right knee pain.  Pain began 2 days ago.  Per patient's report, and her sister who accompanies patient, and incident happened on the bus where she may have injured her knee, but patient is unable to fully identify the exact injury.   patient sister reports that she was called from the patient's adult daycare program saying that the patient was in tears, and complaining that her knee hurt.  Patient's sister noted later that day that the patient had an erythematous and edematous right knee.  She gave the patient ibuprofen every 6 hours, and pain appearing to be improved today.  Patient sister reports that knee is still edematous, but no longer erythematous.  Patient and her sister denies any fevers, chills, lower extremity edema, foot pain or edema, distal paresthesias or weakness, calf tenderness, chest pain, shortness of breath, abdominal pain, or nausea or vomiting.  Past Medical History:  Diagnosis Date  . Epilepsy (HCC)   . Hypertension   . Seizures [facility])     Patient Active Problem List   Diagnosis Date Noted  . Partial epilepsy with impairment of consciousness, intractable (HCC) 02/18/2013  . Infantile cerebral palsy (HCC) 02/18/2013  . Encounter for therapeutic drug monitoring 02/18/2013    Past Surgical History:  Procedure Laterality Date  . ABDOMINAL HYSTERECTOMY    . arm surgery    . BACK SURGERY    . BREAST SURGERY     cyst removal     OB History   None      Home Medications    Prior to Admission medications   Medication Sig Start Date End Date Taking? Authorizing Provider  amLODipine (NORVASC) 5 MG tablet  Take 5 mg by mouth daily.    [provider]  betamethasone, augmented, (DIPROLENE) 0.05 % lotion Apply topically 2 (two) times daily.    [provider]  carvedilol (COREG) 6.25 MG tablet Take 6.25 mg by mouth 2 (two) times daily with a meal.    [provider]  DILANTIN 100 MG ER capsule TAKE (1) CAPSULE BY MOUTH TWICE DAILY. 02/05/17   Micki Riley, MD  famotidine (PEPCID) 20 MG tablet Take 20 mg by mouth daily.     [provider]  hydrochlorothiazide (HYDRODIURIL) 25 MG tablet Take 25 mg by mouth daily. 12.5 mg daily    [provider]  hydrOXYzine (ATARAX/VISTARIL) 25 MG tablet  01/08/15   [provider]  KEPPRA 750 MG tablet TAKE 2 TABLETS BY MOUTH 2 TIMES DAILY. 10/31/16   Micki Riley, MD  LAMICTAL 150 MG tablet TAKE 1 TABLET BY MOUTH TWICE DAILY. 02/21/17   Micki Riley, MD  mupirocin ointment (BACTROBAN) 2 % Place 1 application into the nose 2 (two) times daily.    [provider]  nystatin cream (MYCOSTATIN) Apply topically 2 (two) times daily. 10/07/12   Constant, Peggy, MD  simvastatin (ZOCOR) 10 MG tablet Take 10 mg by mouth at bedtime.    [provider]    Family History Family History  Problem Relation Age of Onset  . Transient ischemic attack Mother  Social History Social History   Tobacco Use  . Smoking status: Never Smoker  . Smokeless tobacco: Never Used  Substance Use Topics  . Alcohol use: No  . Drug use: No     Allergies   Aspirin; Benadryl [diphenhydramine hcl (sleep)]; Codeine; and Phenobarbital   Review of Systems Review of Systems  Constitutional: Negative for chills and fever.  Respiratory: Negative for shortness of breath.   Cardiovascular: Negative for chest pain and leg swelling.  Gastrointestinal: Negative for abdominal pain, nausea and vomiting.  Musculoskeletal: Positive for arthralgias and joint swelling.  Skin: Negative for wound.     Physical  Exam Updated Vital Signs BP 107/77 (BP Location: Right Arm)   Pulse 86   Temp 97.6 F (36.4 C) (Oral)   Resp 14   Ht 5' (1.524 m)   Wt 81.6 kg (180 lb)   SpO2 93%   BMI 35.15 kg/m   Physical Exam  Constitutional: She appears well-developed and well-nourished. No distress.  Sitting comfortably in patient's home wheelchair.  Patient with kyphosis, but able to sit up and interact during visit.  HENT:  Head: Normocephalic and atraumatic.  Eyes: Conjunctivae are normal. Right eye exhibits no discharge. Left eye exhibits no discharge.  EOMs normal to gross examination.  Neck: Normal range of motion.  Cardiovascular: Normal rate and regular rhythm.  Intact, 2+ DP pulses bilaterally. There is symmetric and nonpitting lower extremity edema.  Pulmonary/Chest:  Normal respiratory effort. Patient converses comfortably. No audible wheeze or stridor.  Abdominal: She exhibits no distension.  Musculoskeletal: Normal range of motion.  Right knee with tenderness to palpation superior to joint. Full ROM. No joint line tenderness. Slight joint effusion of lateral right knee. No abnormal alignment or patellar mobility. No bruising, erythema or warmth overlaying the joint. No varus/valgus laxity. Negative drawer's, Lachman's and McMurray's.  No crepitus.  2+ DP pulses bilaterally. All compartments are soft. Sensation intact distal to injury.  Neurological: She is alert.  Cranial nerves intact to gross observation. Patient moves extremities without difficulty.  Skin: Skin is warm and dry. She is not diaphoretic.  Psychiatric: She has a normal mood and affect. Her behavior is normal. Judgment and thought content normal.  Nursing note and vitals reviewed.    ED Treatments / Results  Labs (all labs ordered are listed, but only abnormal results are displayed) Labs Reviewed - No data to display  EKG None  Radiology No results found.  Procedures Procedures (including critical care  time)  Medications Ordered in ED Medications - No data to display   Initial Impression / Assessment and Plan / ED Course  I have reviewed the triage vital signs and the nursing notes.  Pertinent labs & imaging results that were available during my care of the patient were reviewed by me and considered in my medical decision making (see chart for details).     Patient nontoxic-appearing, afebrile, and in no acute distress.  Right knee exhibits small amount of swelling of the lateral aspect, but no erythema.  Patient also has no pain with passive range of motion, therefore doubt septic arthritis of the right knee.  Differential diagnosis includes trauma, hemarthrosis, gout, that is resolving with antiinflammatory therapy.  Patient with x-ray showing medial compartment degeneration of the right knee.  Discussed findings with patient and her sister.  Patient to follow-up with primary care in a week for recheck, and continue ibuprofen as needed for knee pain.  This is a shared visit with Dr. Harrold DonathNathan  Pickering. Patient was independently evaluated by this attending physician. Attending physician consulted in evaluation and discharge management.  Final Clinical Impressions(s) / ED Diagnoses   Final diagnoses:  Acute pain of right knee    ED Discharge Orders    None       Delia Chimes 08/18/17 1736    Benjiman Core, MD 08/19/17 1527

## 2017-08-18 NOTE — ED Triage Notes (Signed)
Patient's sister presents with patient. States that she noticed some swelling and heat in right knee. Pt complains of pain right knee pain. Pt's sister states she has been giving patient motrin for pain.

## 2017-08-18 NOTE — Discharge Instructions (Signed)
Please see the information and instructions below regarding your visit.  Your diagnoses today include:  1. Acute pain of right knee   2. Pain    Your examination and x-ray are reassuring today.  It is reassuring that the knee is no longer red, and it is improving.  Tests performed today include: See side panel of your discharge paperwork for testing performed today. Vital signs are listed at the bottom of these instructions.   Medications prescribed:    Take any prescribed medications only as prescribed, and any over the counter medications only as directed on the packaging.  Please continue to take ibuprofen, 400 mg as needed for knee pain.  Home care instructions:  Please follow any educational materials contained in this packet.   Follow-up instructions: Please follow-up with your primary care provider in one week for further evaluation of your symptoms if they are not completely improved.   Return instructions:  Please return to the Emergency Department if you experience worsening symptoms.  Please return to the emergency department if your knee becomes red or swollen again, you have increasing pain, you have swelling of the entire leg, changes in color in your leg, or changes in sensation in your right foot. Please return if you have any other emergent concerns.  Additional Information:   Your vital signs today were: BP 107/77 (BP Location: Right Arm)    Pulse 86    Temp 97.6 F (36.4 C) (Oral)    Resp 14    Ht 5' (1.524 m)    Wt 81.6 kg (180 lb)    SpO2 93%    BMI 35.15 kg/m  If your blood pressure (BP) was elevated on multiple readings during this visit above 130 for the top number or above 80 for the bottom number, please have this repeated by your primary care provider within one month. --------------  Thank you for allowing us to participate in your care today.

## 2017-09-04 DIAGNOSIS — R569 Unspecified convulsions: Secondary | ICD-10-CM | POA: Diagnosis not present

## 2017-09-04 DIAGNOSIS — J069 Acute upper respiratory infection, unspecified: Secondary | ICD-10-CM | POA: Diagnosis not present

## 2017-09-04 DIAGNOSIS — G809 Cerebral palsy, unspecified: Secondary | ICD-10-CM | POA: Diagnosis not present

## 2017-09-04 DIAGNOSIS — E118 Type 2 diabetes mellitus with unspecified complications: Secondary | ICD-10-CM | POA: Diagnosis not present

## 2017-09-04 DIAGNOSIS — I10 Essential (primary) hypertension: Secondary | ICD-10-CM | POA: Diagnosis not present

## 2017-09-04 DIAGNOSIS — E785 Hyperlipidemia, unspecified: Secondary | ICD-10-CM | POA: Diagnosis not present

## 2017-10-26 ENCOUNTER — Other Ambulatory Visit: Payer: Self-pay | Admitting: Neurology

## 2017-12-24 DIAGNOSIS — R569 Unspecified convulsions: Secondary | ICD-10-CM | POA: Diagnosis not present

## 2017-12-24 DIAGNOSIS — E785 Hyperlipidemia, unspecified: Secondary | ICD-10-CM | POA: Diagnosis not present

## 2017-12-24 DIAGNOSIS — I10 Essential (primary) hypertension: Secondary | ICD-10-CM | POA: Diagnosis not present

## 2017-12-24 DIAGNOSIS — G809 Cerebral palsy, unspecified: Secondary | ICD-10-CM | POA: Diagnosis not present

## 2017-12-26 ENCOUNTER — Telehealth: Payer: Self-pay | Admitting: Nurse Practitioner

## 2017-12-26 NOTE — Telephone Encounter (Addendum)
Received fax from [employer], Medicare approval for brand Keppra and Lamictal. Approved from 09/27/17 - 12/26/18 on both medications, non formulary. Approval letters faxed to [facility].

## 2017-12-26 NOTE — Telephone Encounter (Addendum)
[facility] both Keppra and Lamictal, brand names need PA. She will fax over cover my meds forms for completion.  Received CMM forms and started PA process. Keppra brand, key [id number]; Lamictal brand , key [id number]. Your information has been submitted to [employer]. [employer] will review the request and will issue a decision, typically within 1-3 days from your submission. You can check the updated outcome later by reopening this request.  If [employer] has not responded in 1-3 days or if you have any questions about your ePA request, please contact [employer] at [phone]. Called sister, [name] and advised her of approvals for Keppra and Lamictal. She verbalized understanding, appreciation.

## 2017-12-26 NOTE — Telephone Encounter (Signed)
[name] patient's sister (on [place]) states [facility] [place]  KEP[place]A 750 MG tablet. [name] would like a returned call to discuss this.

## 2017-12-26 NOTE — Telephone Encounter (Signed)
Called [facility] for [employer] information. [facility] for [facility] and requested a call back.

## 2017-12-31 ENCOUNTER — Other Ambulatory Visit: Payer: Self-pay | Admitting: Neurology

## 2018-01-01 ENCOUNTER — Other Ambulatory Visit: Payer: Self-pay

## 2018-01-01 MED ORDER — DILANTIN 100 MG PO CAPS: ORAL_CAPSULE | ORAL | 3 refills | Status: DC

## 2018-01-09 DIAGNOSIS — H01022 Squamous blepharitis right lower eyelid: Secondary | ICD-10-CM | POA: Diagnosis not present

## 2018-01-09 DIAGNOSIS — H2513 Age-related nuclear cataract, bilateral: Secondary | ICD-10-CM | POA: Diagnosis not present

## 2018-01-09 DIAGNOSIS — H01021 Squamous blepharitis right upper eyelid: Secondary | ICD-10-CM | POA: Diagnosis not present

## 2018-01-09 DIAGNOSIS — H01025 Squamous blepharitis left lower eyelid: Secondary | ICD-10-CM | POA: Diagnosis not present

## 2018-01-09 DIAGNOSIS — H401134 Primary open-angle glaucoma, bilateral, indeterminate stage: Secondary | ICD-10-CM | POA: Diagnosis not present

## 2018-01-09 DIAGNOSIS — H01024 Squamous blepharitis left upper eyelid: Secondary | ICD-10-CM | POA: Diagnosis not present

## 2018-03-16 ENCOUNTER — Other Ambulatory Visit: Payer: Self-pay | Admitting: Neurology

## 2018-03-18 ENCOUNTER — Other Ambulatory Visit: Payer: Self-pay

## 2018-03-18 MED ORDER — LAMICTAL 150 MG PO TABS: 150.0000 mg | ORAL_TABLET | Freq: Two times a day (BID) | ORAL | 12 refills | Status: DC

## 2018-04-08 DIAGNOSIS — I10 Essential (primary) hypertension: Secondary | ICD-10-CM | POA: Diagnosis not present

## 2018-04-08 DIAGNOSIS — E785 Hyperlipidemia, unspecified: Secondary | ICD-10-CM | POA: Diagnosis not present

## 2018-04-08 DIAGNOSIS — R569 Unspecified convulsions: Secondary | ICD-10-CM | POA: Diagnosis not present

## 2018-04-08 DIAGNOSIS — L8990 Pressure ulcer of unspecified site, unspecified stage: Secondary | ICD-10-CM | POA: Diagnosis not present

## 2018-04-16 DIAGNOSIS — Z23 Encounter for immunization: Secondary | ICD-10-CM | POA: Diagnosis not present

## 2018-05-08 DIAGNOSIS — R569 Unspecified convulsions: Secondary | ICD-10-CM | POA: Diagnosis not present

## 2018-05-08 DIAGNOSIS — E785 Hyperlipidemia, unspecified: Secondary | ICD-10-CM | POA: Diagnosis not present

## 2018-05-08 DIAGNOSIS — I10 Essential (primary) hypertension: Secondary | ICD-10-CM | POA: Diagnosis not present

## 2018-05-22 DIAGNOSIS — I10 Essential (primary) hypertension: Secondary | ICD-10-CM | POA: Diagnosis not present

## 2018-05-22 DIAGNOSIS — Z4800 Encounter for change or removal of nonsurgical wound dressing: Secondary | ICD-10-CM | POA: Diagnosis not present

## 2018-05-22 DIAGNOSIS — G809 Cerebral palsy, unspecified: Secondary | ICD-10-CM | POA: Diagnosis not present

## 2018-05-22 DIAGNOSIS — Z79899 Other long term (current) drug therapy: Secondary | ICD-10-CM | POA: Diagnosis not present

## 2018-05-22 DIAGNOSIS — L89322 Pressure ulcer of left buttock, stage 2: Secondary | ICD-10-CM | POA: Diagnosis not present

## 2018-05-22 DIAGNOSIS — G40909 Epilepsy, unspecified, not intractable, without status epilepticus: Secondary | ICD-10-CM | POA: Diagnosis not present

## 2018-05-23 ENCOUNTER — Other Ambulatory Visit: Payer: Self-pay | Admitting: Neurology

## 2018-05-23 DIAGNOSIS — Z79899 Other long term (current) drug therapy: Secondary | ICD-10-CM | POA: Diagnosis not present

## 2018-05-23 DIAGNOSIS — I10 Essential (primary) hypertension: Secondary | ICD-10-CM | POA: Diagnosis not present

## 2018-05-23 DIAGNOSIS — Z4800 Encounter for change or removal of nonsurgical wound dressing: Secondary | ICD-10-CM | POA: Diagnosis not present

## 2018-05-23 DIAGNOSIS — G809 Cerebral palsy, unspecified: Secondary | ICD-10-CM | POA: Diagnosis not present

## 2018-05-23 DIAGNOSIS — G40909 Epilepsy, unspecified, not intractable, without status epilepticus: Secondary | ICD-10-CM | POA: Diagnosis not present

## 2018-05-23 DIAGNOSIS — L89322 Pressure ulcer of left buttock, stage 2: Secondary | ICD-10-CM | POA: Diagnosis not present

## 2018-05-27 ENCOUNTER — Other Ambulatory Visit: Payer: Self-pay

## 2018-05-27 MED ORDER — KEPPRA 750 MG PO TABS: 1500.0000 mg | ORAL_TABLET | Freq: Two times a day (BID) | ORAL | 12 refills | Status: DC

## 2018-05-28 NOTE — Progress Notes (Signed)
[facility]  PATIENT: Barbara ChampionLeah G Archer DOB: 04/30/1956   REASON FOR VISIT: Follow-up for myoclonus jerks, cerebral palsy and seizure disorder HISTORY FROM: Patient and sister [name]    HISTORY OF PRESENT ILLNESS: [name] 63 -year-old female returns for followup. Her  last visit 02/18/13.She has a long standing history of cerebral palsy and seizure disorder as well as myoclonic jerks. She has not had generalized seizure in now for more than 7 years She does have occasional myoclonic jerks which are transient .She does take p.r.n. ativan which helps but takes it only when the jerking is prolonged. Patient has tried tapering and stopping Dilantin in the past without success as this led to withdrawal seizures She is troubled by left hand developing forced flexion deformity. She however denies pain and discomfort. She has not had any new health concerns since last visit except mild weight gain and being treated for respiratory infection. She had an ER visit in April for dehydration. Labs were done at that time and reviewed today. Dilantin level 16.1. She returns for reevaluation  Update 02/23/2015 : She is seen today for routine follow-up visit after a year accompanied by her sister. She continues to do well without the generalized tonic-clonic seizure activity now for 8 years. She continues to have intermittent brief myoclonic jerks around 1 or 2 days per month. The mother notices that these are more prominent when she is constipated for a few days. She remains on Dilantin 100 mg twice daily, lamotrigine 150 mg twice daily and Keppra 1500 mg twice daily. She seems to be tolerating these medications well without any side effects. She is had no new health problems since the last visit. She does have upcoming appointment with primary care physician tomorrow and plans to get some routine lab work checked. The patient remains non-ambulant and uses electric wheelchair. Update 11/8/2017PS ;  she returns for follow-up after last visit a year ago. She is accompanied by his sister and niece. She continues to do well and has not had a seizure now for 9 years. She remains on Dilantin 200 mg a day, lamotrigine 150 mg twice daily and Keppra 1500 mg twice daily. She is tolerating these medications well without any side effects. She's had no new interval health problems or hospitalizations. She has not had any recent lab work done by her primary physician either. Patient does not keen to taper and reduce her medications. She continues to have a occasional intermittent myoclonic jerks but these are not bothersome UPDATE 1/29/2019CM  [name], 63 year old female returns for follow-up accompanied by her sister and brother.  She continues to do well with no seizure events in over 10 years.  She continues to have some occasional intermittent myoclonic jerks but these are not bothersome.  She remains on Dilantin 200 day, Lamictal 150 twice daily and Keppra 1500 twice daily.  She does not have side effects to her medications.  She has not had  lab work in the last year.  She has not had interval medical issues.  She returns for reevaluation UPDATE 1/29/2020CM [name], 63 year old female returns for follow-up accompanied by her Sister [name] with whom she lives.  She has a history of seizure disorder but no seizures in over 11 years.  She continues to have occasional intermittent myoclonic jerks but these are not bothersome.  She remains on Dilantin 200 today Lamictal 150 twice daily and Keppra 1500 twice daily.  Sister reports she had recent labs done at  her primary care Dr. Loleta Chance.  I do not have access to those records but she will sign so that we can get those.  She reports that the Dilantin level was elevated however in the past her Dilantin level has been elevated but she has refused to decrease the Dilantin dose.  She denies any side effects to her medications appetite is good and she is sleeping well.   There have been no interval medical issues.  She is totally dependent for all ADLs.  She is transferred with a Michiel Sites lift.  She has an Passenger transport manager.  She returns for reevaluation  REVIEW OF SYSTEMS: Full 14 system review of systems performed and notable only for those listed, all others are neg:  Constitutional: neg  Cardiovascular: neg Ear/Nose/Throat: Drooling Skin: neg Eyes: neg Respiratory: neg Gastroitestinal: neg  Hematology/Lymphatic: neg  Endocrine: neg Musculoskeletal: Wheelchair-bound Allergy/Immunology: neg Neurological: History of seizure disorder, myoclonic jerks cerebral palsy Psychiatric: neg Sleep : neg   ALLERGIES: Allergies  Allergen Reactions  . Aspirin   . Benadryl [Diphenhydramine Hcl (Sleep)]   . Codeine   . Phenobarbital     HOME MEDICATIONS: Outpatient Medications Prior to Visit  Medication Sig Dispense Refill  . amLODipine (NORVASC) 5 MG tablet Take 5 mg by mouth daily.    . betamethasone, augmented, (DIPROLENE) 0.05 % lotion Apply topically 2 (two) times daily.    . carvedilol (COREG) 6.25 MG tablet Take 6.25 mg by mouth 2 (two) times daily with a meal.    . DILANTIN 100 MG ER capsule TAKE (1) CAPSULE BY MOUTH TWICE DAILY. 180 capsule 3  . famotidine (PEPCID) 20 MG tablet Take 20 mg by mouth daily.     . hydrochlorothiazide (HYDRODIURIL) 25 MG tablet Take 25 mg by mouth daily. 12.5 mg daily    . hydrOXYzine (ATARAX/VISTARIL) 25 MG tablet     . KEPPRA 750 MG tablet Take 2 tablets (1,500 mg total) by mouth 2 (two) times daily. 120 tablet 12  . LAMICTAL 150 MG tablet Take 1 tablet (150 mg total) by mouth 2 (two) times daily. 60 tablet 12  . mupirocin ointment (BACTROBAN) 2 % Place 1 application into the nose 2 (two) times daily.    Marland Kitchen nystatin cream (MYCOSTATIN) Apply topically 2 (two) times daily. 30 g 1  . simvastatin (ZOCOR) 10 MG tablet Take 10 mg by mouth at bedtime.     No facility-administered medications prior to visit.     PAST  MEDICAL HISTORY: Past Medical History:  Diagnosis Date  . Epilepsy (HCC)   . Hypertension   . Seizures (HCC)     PAST SURGICAL HISTORY: Past Surgical History:  Procedure Laterality Date  . ABDOMINAL HYSTERECTOMY    . arm surgery    . BACK SURGERY    . BREAST SURGERY     cyst removal    FAMILY HISTORY: Family History  Problem Relation Age of Onset  . Transient ischemic attack Mother     SOCIAL HISTORY: Social History   Socioeconomic History  . Marital status: Single    Spouse name: Not on file  . Number of children: Not on file  . Years of education: Not on file  . Highest education level: Not on file  Occupational History  . Not on file  Social Needs  . Financial resource strain: Not on file  . Food insecurity:    Worry: Not on file    Inability: Not on file  . Transportation needs:  Medical: Not on file    Non-medical: Not on file  Tobacco Use  . Smoking status: Never Smoker  . Smokeless tobacco: Never Used  Substance and Sexual Activity  . Alcohol use: No  . Drug use: No  . Sexual activity: Not on file  Lifestyle  . Physical activity:    Days per week: Not on file    Minutes per session: Not on file  . Stress: Not on file  Relationships  . Social connections:    Talks on phone: Not on file    Gets together: Not on file    Attends religious service: Not on file    Active member of club or organization: Not on file    Attends meetings of clubs or organizations: Not on file    Relationship status: Not on file  . Intimate partner violence:    Fear of current or ex partner: Not on file    Emotionally abused: Not on file    Physically abused: Not on file    Forced sexual activity: Not on file  Other Topics Concern  . Not on file  Social History Narrative   Lives with sister, [name]     PHYSICAL EXAM  Vitals:   05/29/18 1256  BP: 105/63  Pulse: 80   There is no height or weight on file to calculate BMI.  Generalized: Well developed,  obese female in no acute distress  Head: normocephalic and atraumatic,. Oropharynx benign  Neck: Supple,  Musculoskeletal: Severe scoliosis to the left  Neurological examination   Mentation: Alert severely dysarthric follows all commands    Cranial nerve II-XII: Pupils were equal round reactive to light extraocular movements were full, visual field were full on confrontational test.  Mild left facial weakness . hearing was intact to finger rubbing bilaterally. Uvula tongue midline. head turning and shoulder shrug were normal and symmetric.Tongue protrusion into cheek strength was normal. 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  Good strength on the right side  Sensory: normal and symmetric to light touch, pinprick, and  Vibration in the upper and lower extremity  Coordination: finger-nose-finger, normal on the right  unable to perform on the left Reflexes: Brisker on the left and right Gait and Station: Wheelchair-bound not ambulated DIAGNOSTIC DATA (LABS, IMAGING, TESTING) - I reviewed patient records, labs, notes, testing and imaging myself where available.  Lab Results  Component Value Date   WBC 5.9 05/29/2017   HGB 12.2 05/29/2017   HCT 35.2 05/29/2017   MCV 86 05/29/2017   PLT 262 05/29/2017      Component Value Date/Time   NA 148 (H) 05/29/2017 1331   K 3.4 (L) 05/29/2017 1331   CL 102 05/29/2017 1331   CO2 26 05/29/2017 1331   GLUCOSE 93 05/29/2017 1331   GLUCOSE 95 08/05/2013 1723   BUN 9 05/29/2017 1331   CREATININE 0.64 05/29/2017 1331   CALCIUM 9.3 05/29/2017 1331   PROT 7.4 05/29/2017 1331   ALBUMIN 4.3 05/29/2017 1331   AST 16 05/29/2017 1331   ALT 10 05/29/2017 1331   ALKPHOS 380 (H) 05/29/2017 1331   BILITOT 0.2 05/29/2017 1331   GFRNONAA 97 05/29/2017 1331   GFRAA 112 05/29/2017 1331        ASSESSMENT AND PLAN 63 y.o. year old female  has a past medical history of Epilepsy  here to  followup. No generalized seizure activity in 11years but intermittent brief myoclonic jerks continue.  Continue Dilantin, Keppra, and Lamictal at current doses (family does not wish to taper Dilantin in the past due to recurrent seizures) Will sign to get recent labs from PCP Dr. Mirna Mires Follow-up yearly and as needed Nilda Riggs, [facility], [facility], APRN  [facility] [address] [place], [place] [address] [phone]

## 2018-05-29 ENCOUNTER — Encounter: Payer: Self-pay | Admitting: Nurse Practitioner

## 2018-05-29 ENCOUNTER — Ambulatory Visit (INDEPENDENT_AMBULATORY_CARE_PROVIDER_SITE_OTHER): Payer: Medicare Other | Admitting: Nurse Practitioner

## 2018-05-29 VITALS — BP 105/63 | HR 80

## 2018-05-29 DIAGNOSIS — G40219 Localization-related (focal) (partial) symptomatic epilepsy and epileptic syndromes with complex partial seizures, intractable, without status epilepticus: Secondary | ICD-10-CM | POA: Diagnosis not present

## 2018-05-29 DIAGNOSIS — G253 Myoclonus: Secondary | ICD-10-CM | POA: Diagnosis not present

## 2018-05-29 DIAGNOSIS — G809 Cerebral palsy, unspecified: Secondary | ICD-10-CM | POA: Diagnosis not present

## 2018-05-29 NOTE — Patient Instructions (Signed)
Continue Dilantin, Keppra, and Lamictal at current doses (family does not wish to taper Dilantin in the past due to recurrent seizures) Will sign to get recent labs from PCP Dr. Mirna Mires Follow-up yearly and as needed

## 2018-05-30 NOTE — Progress Notes (Signed)
I agree with the above plan 

## 2018-05-31 DIAGNOSIS — L89322 Pressure ulcer of left buttock, stage 2: Secondary | ICD-10-CM | POA: Diagnosis not present

## 2018-05-31 DIAGNOSIS — I10 Essential (primary) hypertension: Secondary | ICD-10-CM | POA: Diagnosis not present

## 2018-05-31 DIAGNOSIS — G809 Cerebral palsy, unspecified: Secondary | ICD-10-CM | POA: Diagnosis not present

## 2018-05-31 DIAGNOSIS — G40909 Epilepsy, unspecified, not intractable, without status epilepticus: Secondary | ICD-10-CM | POA: Diagnosis not present

## 2018-05-31 DIAGNOSIS — Z4800 Encounter for change or removal of nonsurgical wound dressing: Secondary | ICD-10-CM | POA: Diagnosis not present

## 2018-05-31 DIAGNOSIS — Z79899 Other long term (current) drug therapy: Secondary | ICD-10-CM | POA: Diagnosis not present

## 2018-06-05 DIAGNOSIS — G40909 Epilepsy, unspecified, not intractable, without status epilepticus: Secondary | ICD-10-CM | POA: Diagnosis not present

## 2018-06-05 DIAGNOSIS — I10 Essential (primary) hypertension: Secondary | ICD-10-CM | POA: Diagnosis not present

## 2018-06-05 DIAGNOSIS — Z79899 Other long term (current) drug therapy: Secondary | ICD-10-CM | POA: Diagnosis not present

## 2018-06-05 DIAGNOSIS — Z4800 Encounter for change or removal of nonsurgical wound dressing: Secondary | ICD-10-CM | POA: Diagnosis not present

## 2018-06-05 DIAGNOSIS — L89322 Pressure ulcer of left buttock, stage 2: Secondary | ICD-10-CM | POA: Diagnosis not present

## 2018-06-05 DIAGNOSIS — G809 Cerebral palsy, unspecified: Secondary | ICD-10-CM | POA: Diagnosis not present

## 2018-06-14 DIAGNOSIS — G40909 Epilepsy, unspecified, not intractable, without status epilepticus: Secondary | ICD-10-CM | POA: Diagnosis not present

## 2018-06-14 DIAGNOSIS — Z79899 Other long term (current) drug therapy: Secondary | ICD-10-CM | POA: Diagnosis not present

## 2018-06-14 DIAGNOSIS — Z4800 Encounter for change or removal of nonsurgical wound dressing: Secondary | ICD-10-CM | POA: Diagnosis not present

## 2018-06-14 DIAGNOSIS — L89322 Pressure ulcer of left buttock, stage 2: Secondary | ICD-10-CM | POA: Diagnosis not present

## 2018-06-14 DIAGNOSIS — G809 Cerebral palsy, unspecified: Secondary | ICD-10-CM | POA: Diagnosis not present

## 2018-06-14 DIAGNOSIS — I10 Essential (primary) hypertension: Secondary | ICD-10-CM | POA: Diagnosis not present

## 2018-06-19 DIAGNOSIS — I10 Essential (primary) hypertension: Secondary | ICD-10-CM | POA: Diagnosis not present

## 2018-06-19 DIAGNOSIS — G809 Cerebral palsy, unspecified: Secondary | ICD-10-CM | POA: Diagnosis not present

## 2018-06-19 DIAGNOSIS — L89322 Pressure ulcer of left buttock, stage 2: Secondary | ICD-10-CM | POA: Diagnosis not present

## 2018-06-19 DIAGNOSIS — Z79899 Other long term (current) drug therapy: Secondary | ICD-10-CM | POA: Diagnosis not present

## 2018-06-19 DIAGNOSIS — Z4800 Encounter for change or removal of nonsurgical wound dressing: Secondary | ICD-10-CM | POA: Diagnosis not present

## 2018-06-19 DIAGNOSIS — G40909 Epilepsy, unspecified, not intractable, without status epilepticus: Secondary | ICD-10-CM | POA: Diagnosis not present

## 2018-06-21 DIAGNOSIS — G40909 Epilepsy, unspecified, not intractable, without status epilepticus: Secondary | ICD-10-CM | POA: Diagnosis not present

## 2018-06-21 DIAGNOSIS — Z4800 Encounter for change or removal of nonsurgical wound dressing: Secondary | ICD-10-CM | POA: Diagnosis not present

## 2018-06-21 DIAGNOSIS — Z79899 Other long term (current) drug therapy: Secondary | ICD-10-CM | POA: Diagnosis not present

## 2018-06-21 DIAGNOSIS — L89322 Pressure ulcer of left buttock, stage 2: Secondary | ICD-10-CM | POA: Diagnosis not present

## 2018-06-21 DIAGNOSIS — G809 Cerebral palsy, unspecified: Secondary | ICD-10-CM | POA: Diagnosis not present

## 2018-06-21 DIAGNOSIS — I10 Essential (primary) hypertension: Secondary | ICD-10-CM | POA: Diagnosis not present

## 2018-06-28 DIAGNOSIS — I10 Essential (primary) hypertension: Secondary | ICD-10-CM | POA: Diagnosis not present

## 2018-06-28 DIAGNOSIS — Z79899 Other long term (current) drug therapy: Secondary | ICD-10-CM | POA: Diagnosis not present

## 2018-06-28 DIAGNOSIS — Z4800 Encounter for change or removal of nonsurgical wound dressing: Secondary | ICD-10-CM | POA: Diagnosis not present

## 2018-06-28 DIAGNOSIS — G40909 Epilepsy, unspecified, not intractable, without status epilepticus: Secondary | ICD-10-CM | POA: Diagnosis not present

## 2018-06-28 DIAGNOSIS — L89322 Pressure ulcer of left buttock, stage 2: Secondary | ICD-10-CM | POA: Diagnosis not present

## 2018-06-28 DIAGNOSIS — G809 Cerebral palsy, unspecified: Secondary | ICD-10-CM | POA: Diagnosis not present

## 2018-07-02 DIAGNOSIS — Z4800 Encounter for change or removal of nonsurgical wound dressing: Secondary | ICD-10-CM | POA: Diagnosis not present

## 2018-07-02 DIAGNOSIS — I10 Essential (primary) hypertension: Secondary | ICD-10-CM | POA: Diagnosis not present

## 2018-07-02 DIAGNOSIS — Z79899 Other long term (current) drug therapy: Secondary | ICD-10-CM | POA: Diagnosis not present

## 2018-07-02 DIAGNOSIS — G809 Cerebral palsy, unspecified: Secondary | ICD-10-CM | POA: Diagnosis not present

## 2018-07-02 DIAGNOSIS — G40909 Epilepsy, unspecified, not intractable, without status epilepticus: Secondary | ICD-10-CM | POA: Diagnosis not present

## 2018-07-02 DIAGNOSIS — L89322 Pressure ulcer of left buttock, stage 2: Secondary | ICD-10-CM | POA: Diagnosis not present

## 2018-07-05 DIAGNOSIS — Z79899 Other long term (current) drug therapy: Secondary | ICD-10-CM | POA: Diagnosis not present

## 2018-07-05 DIAGNOSIS — G40909 Epilepsy, unspecified, not intractable, without status epilepticus: Secondary | ICD-10-CM | POA: Diagnosis not present

## 2018-07-05 DIAGNOSIS — I10 Essential (primary) hypertension: Secondary | ICD-10-CM | POA: Diagnosis not present

## 2018-07-05 DIAGNOSIS — L89322 Pressure ulcer of left buttock, stage 2: Secondary | ICD-10-CM | POA: Diagnosis not present

## 2018-07-05 DIAGNOSIS — G809 Cerebral palsy, unspecified: Secondary | ICD-10-CM | POA: Diagnosis not present

## 2018-07-05 DIAGNOSIS — Z4800 Encounter for change or removal of nonsurgical wound dressing: Secondary | ICD-10-CM | POA: Diagnosis not present

## 2018-07-08 DIAGNOSIS — I10 Essential (primary) hypertension: Secondary | ICD-10-CM | POA: Diagnosis not present

## 2018-07-08 DIAGNOSIS — G809 Cerebral palsy, unspecified: Secondary | ICD-10-CM | POA: Diagnosis not present

## 2018-07-08 DIAGNOSIS — R569 Unspecified convulsions: Secondary | ICD-10-CM | POA: Diagnosis not present

## 2018-07-08 DIAGNOSIS — L89309 Pressure ulcer of unspecified buttock, unspecified stage: Secondary | ICD-10-CM | POA: Diagnosis not present

## 2018-07-08 DIAGNOSIS — E785 Hyperlipidemia, unspecified: Secondary | ICD-10-CM | POA: Diagnosis not present

## 2018-07-12 DIAGNOSIS — Z4800 Encounter for change or removal of nonsurgical wound dressing: Secondary | ICD-10-CM | POA: Diagnosis not present

## 2018-07-12 DIAGNOSIS — G40909 Epilepsy, unspecified, not intractable, without status epilepticus: Secondary | ICD-10-CM | POA: Diagnosis not present

## 2018-07-12 DIAGNOSIS — G809 Cerebral palsy, unspecified: Secondary | ICD-10-CM | POA: Diagnosis not present

## 2018-07-12 DIAGNOSIS — L89322 Pressure ulcer of left buttock, stage 2: Secondary | ICD-10-CM | POA: Diagnosis not present

## 2018-07-12 DIAGNOSIS — Z79899 Other long term (current) drug therapy: Secondary | ICD-10-CM | POA: Diagnosis not present

## 2018-07-12 DIAGNOSIS — I10 Essential (primary) hypertension: Secondary | ICD-10-CM | POA: Diagnosis not present

## 2018-07-19 DIAGNOSIS — I10 Essential (primary) hypertension: Secondary | ICD-10-CM | POA: Diagnosis not present

## 2018-07-19 DIAGNOSIS — Z79899 Other long term (current) drug therapy: Secondary | ICD-10-CM | POA: Diagnosis not present

## 2018-07-19 DIAGNOSIS — G809 Cerebral palsy, unspecified: Secondary | ICD-10-CM | POA: Diagnosis not present

## 2018-07-19 DIAGNOSIS — Z4800 Encounter for change or removal of nonsurgical wound dressing: Secondary | ICD-10-CM | POA: Diagnosis not present

## 2018-07-19 DIAGNOSIS — L89322 Pressure ulcer of left buttock, stage 2: Secondary | ICD-10-CM | POA: Diagnosis not present

## 2018-07-19 DIAGNOSIS — G40909 Epilepsy, unspecified, not intractable, without status epilepticus: Secondary | ICD-10-CM | POA: Diagnosis not present

## 2018-12-17 ENCOUNTER — Telehealth: Payer: Self-pay | Admitting: *Deleted

## 2018-12-17 NOTE — Telephone Encounter (Signed)
Completed Lamictal 150 mg PA on CMM. KEY: Key: [id number]. Awaiting Medicare Part D determination.  Per CMM: If [employer] Part D has not responded in 1-3 days or if you have any questions about your ePA request, please contact [employer] Part D at [phone]. If you think there may be a problem with your PA request, use our live chat feature at the bottom right.

## 2018-12-18 NOTE — Telephone Encounter (Signed)
Received approval for lamictal 09-18-18 thru 12-17-19  ID [id number]. [employer].  [phone].  Fax confirmation to [employer] [phone].

## 2018-12-24 ENCOUNTER — Other Ambulatory Visit: Payer: Self-pay | Admitting: Neurology

## 2018-12-25 ENCOUNTER — Telehealth: Payer: Self-pay | Admitting: *Deleted

## 2018-12-25 ENCOUNTER — Other Ambulatory Visit: Payer: Self-pay | Admitting: Family Medicine

## 2018-12-25 DIAGNOSIS — Z1231 Encounter for screening mammogram for malignant neoplasm of breast: Secondary | ICD-10-CM

## 2018-12-25 NOTE — Telephone Encounter (Signed)
Initiated thru [place] for Brand name Keppra.

## 2018-12-26 ENCOUNTER — Other Ambulatory Visit: Payer: Self-pay

## 2018-12-26 MED ORDER — DILANTIN 100 MG PO CAPS: ORAL_CAPSULE | ORAL | 3 refills | Status: DC

## 2018-12-26 NOTE — Telephone Encounter (Signed)
Received Approval for KEPPRA ID # [id number] from 09-26-18 thru 12-25-2019.   [employer]. [phone].  Fax confirmation received [phone] [employer].

## 2019-01-08 DIAGNOSIS — G809 Cerebral palsy, unspecified: Secondary | ICD-10-CM | POA: Diagnosis not present

## 2019-01-08 DIAGNOSIS — Z803 Family history of malignant neoplasm of breast: Secondary | ICD-10-CM | POA: Diagnosis not present

## 2019-01-08 DIAGNOSIS — N6325 Unspecified lump in the left breast, overlapping quadrants: Secondary | ICD-10-CM | POA: Diagnosis not present

## 2019-01-08 DIAGNOSIS — N6315 Unspecified lump in the right breast, overlapping quadrants: Secondary | ICD-10-CM | POA: Diagnosis not present

## 2019-01-08 DIAGNOSIS — R5381 Other malaise: Secondary | ICD-10-CM | POA: Diagnosis not present

## 2019-01-08 DIAGNOSIS — N6321 Unspecified lump in the left breast, upper outer quadrant: Secondary | ICD-10-CM | POA: Diagnosis not present

## 2019-01-13 ENCOUNTER — Telehealth: Payer: Self-pay | Admitting: Neurology

## 2019-01-13 NOTE — Telephone Encounter (Signed)
I called pts sister Blanch Media about her sister needing a biopsy done now rather than ultrasound. Pt was schedule for a mammogram. I stated pt will continue her seizure medication as prescribed for any of the test. I stated if the biopsy requires her having anesthesia the MD may send a clearance form to discuss her seizure medication. I stated if no surgery pt will continue her seizure medication as prescribed. The sister verbalized understanding.

## 2019-01-13 NOTE — Telephone Encounter (Signed)
Pt's sister called stating that she was to get a Mammogram but when they got there they were not able to do it but they did an Ultrasound instead. Now they are wanting to do a Biopsy and the pt's sister is wanting to know the providers thoughts of this. Please advise.

## 2019-01-21 ENCOUNTER — Other Ambulatory Visit: Payer: Self-pay | Admitting: Family Medicine

## 2019-01-21 DIAGNOSIS — N6320 Unspecified lump in the left breast, unspecified quadrant: Secondary | ICD-10-CM

## 2019-01-21 DIAGNOSIS — R928 Other abnormal and inconclusive findings on diagnostic imaging of breast: Secondary | ICD-10-CM

## 2019-01-21 DIAGNOSIS — N6310 Unspecified lump in the right breast, unspecified quadrant: Secondary | ICD-10-CM

## 2019-01-29 ENCOUNTER — Other Ambulatory Visit: Payer: Self-pay

## 2019-01-29 ENCOUNTER — Ambulatory Visit
Admission: RE | Admit: 2019-01-29 | Discharge: 2019-01-29 | Disposition: A | Discharge status: Home / Self Care | Payer: Medicare Other | Source: Ambulatory Visit | Attending: Family Medicine | Admitting: Family Medicine

## 2019-01-29 ENCOUNTER — Other Ambulatory Visit: Payer: Self-pay | Admitting: Family Medicine

## 2019-01-29 DIAGNOSIS — N6310 Unspecified lump in the right breast, unspecified quadrant: Secondary | ICD-10-CM

## 2019-01-29 DIAGNOSIS — N6321 Unspecified lump in the left breast, upper outer quadrant: Secondary | ICD-10-CM | POA: Diagnosis not present

## 2019-01-29 DIAGNOSIS — N6320 Unspecified lump in the left breast, unspecified quadrant: Secondary | ICD-10-CM

## 2019-01-29 DIAGNOSIS — R928 Other abnormal and inconclusive findings on diagnostic imaging of breast: Secondary | ICD-10-CM

## 2019-01-29 DIAGNOSIS — N6314 Unspecified lump in the right breast, lower inner quadrant: Secondary | ICD-10-CM | POA: Diagnosis not present

## 2019-01-29 DIAGNOSIS — N6312 Unspecified lump in the right breast, upper inner quadrant: Secondary | ICD-10-CM | POA: Diagnosis not present

## 2019-01-29 DIAGNOSIS — N6322 Unspecified lump in the left breast, upper inner quadrant: Secondary | ICD-10-CM | POA: Diagnosis not present

## 2019-03-11 ENCOUNTER — Other Ambulatory Visit: Payer: Self-pay | Admitting: Neurology

## 2019-05-20 ENCOUNTER — Other Ambulatory Visit: Payer: Self-pay | Admitting: Neurology

## 2019-06-02 ENCOUNTER — Telehealth: Payer: Self-pay | Admitting: Nurse Practitioner

## 2019-06-02 ENCOUNTER — Ambulatory Visit: Payer: Self-pay | Admitting: Adult Health

## 2019-06-02 NOTE — Telephone Encounter (Signed)
Noted  

## 2019-06-02 NOTE — Telephone Encounter (Signed)
Blackstock,Joyce(pt sister on [place]) called to report pt wont make appointment today,

## 2019-06-02 NOTE — Progress Notes (Deleted)
[facility]  PATIENT: [name] DOB: 09-19-1955   REASON FOR VISIT: Follow-up for myoclonus jerks, cerebral palsy and seizure disorder HISTORY FROM: Patient and sister [name] provider: Dr. Pearlean Brownie   No chief complaint on file.     [name] is a 64 year old female who is being seen today, 06/02/2019, for 1 year seizure follow-up.  She has been followed in this office since 2014.  She has been doing well without any reoccurring seizure activity or symptoms.  She has not had any seizure activity in over 12 years.  She will occasionally have myoclonic jerks but are not bothersome.  She remains on Dilantin 200 mg daily, Lamictal 150 mg twice daily and Keppra 1500 mg twice daily.  Dilantin dosage previously tried to be decreased due to elevated level but unfortunately had withdrawal seizures.  Denies any related side effects.  She remains dependent for all ADLs and is wheelchair-bound.  She continues to live with her sister who helps assist with daily needs.  No further concerns at this time.     REVIEW OF SYSTEMS: Full 14 system review of systems performed and notable only for those listed, all others are neg:  Constitutional: neg  Cardiovascular: neg Ear/Nose/Throat: Drooling Skin: neg Eyes: neg Respiratory: neg Gastroitestinal: neg  Hematology/Lymphatic: neg  Endocrine: neg Musculoskeletal: Wheelchair-bound Allergy/Immunology: neg Neurological: History of seizure disorder, myoclonic jerks cerebral palsy Psychiatric: neg Sleep : neg   ALLERGIES: Allergies  Allergen Reactions  . Aspirin   . Benadryl [Diphenhydramine Hcl (Sleep)]   . Codeine   . Phenobarbital     HOME MEDICATIONS: Outpatient Medications Prior to Visit  Medication Sig Dispense Refill  . amLODipine (NORVASC) 5 MG tablet Take 5 mg by mouth daily.    . betamethasone, augmented, (DIPROLENE) 0.05 % lotion Apply topically 2 (two) times daily.    . carvedilol (COREG) 6.25 MG tablet  Take 6.25 mg by mouth 2 (two) times daily with a meal.    . DILANTIN 100 MG ER capsule TAKE (1) CAPSULE BY MOUTH TWICE DAILY. 180 capsule 3  . famotidine (PEPCID) 20 MG tablet Take 20 mg by mouth daily.     . hydrochlorothiazide (HYDRODIURIL) 25 MG tablet Take 25 mg by mouth daily. 12.5 mg daily    . hydrOXYzine (ATARAX/VISTARIL) 25 MG tablet     . KEPPRA 750 MG tablet TAKE 2 TABLETS BY MOUTH TWICE DAILY. 120 tablet 0  . LAMICTAL 150 MG tablet TAKE 1 TABLET BY MOUTH TWICE DAILY. 60 tablet 12  . mupirocin ointment (BACTROBAN) 2 % Place 1 application into the nose 2 (two) times daily.    Marland Kitchen nystatin cream (MYCOSTATIN) Apply topically 2 (two) times daily. 30 g 1  . simvastatin (ZOCOR) 10 MG tablet Take 10 mg by mouth at bedtime.     No facility-administered medications prior to visit.    PAST MEDICAL HISTORY: Past Medical History:  Diagnosis Date  . Epilepsy (HCC)   . Hypertension   . Seizures (HCC)     PAST SURGICAL HISTORY: Past Surgical History:  Procedure Laterality Date  . ABDOMINAL HYSTERECTOMY    . arm surgery    . BACK SURGERY    . BREAST SURGERY     cyst removal    FAMILY HISTORY: Family History  Problem Relation Age of Onset  . Transient ischemic attack Mother     SOCIAL HISTORY: Social History   Socioeconomic History  . Marital status: Single    Spouse name: Not on  file  . Number of children: Not on file  . Years of education: Not on file  . Highest education level: Not on file  Occupational History  . Not on file  Tobacco Use  . Smoking status: Never Smoker  . Smokeless tobacco: Never Used  Substance and Sexual Activity  . Alcohol use: No  . Drug use: No  . Sexual activity: Not on file  Other Topics Concern  . Not on file  Social History Narrative   Lives with sister, [name]   Social Determinants of Health   Financial Resource Strain:   . Difficulty of Paying Living Expenses: Not on file  Food Insecurity:   . Worried About Programme researcher, broadcasting/film/video  in the Last Year: Not on file  . Ran Out of Food in the Last Year: Not on file  Transportation Needs:   . Lack of Transportation (Medical): Not on file  . Lack of Transportation (Non-Medical): Not on file  Physical Activity:   . Days of Exercise per Week: Not on file  . Minutes of Exercise per Session: Not on file  Stress:   . Feeling of Stress : Not on file  Social Connections:   . Frequency of Communication with Friends and Family: Not on file  . Frequency of Social Gatherings with Friends and Family: Not on file  . Attends Religious Services: Not on file  . Active Member of Clubs or Organizations: Not on file  . Attends Banker Meetings: Not on file  . Marital Status: Not on file  Intimate Partner Violence:   . Fear of Current or Ex-Partner: Not on file  . Emotionally Abused: Not on file  . Physically Abused: Not on file  . Sexually Abused: Not on file     PHYSICAL EXAM  There were no vitals filed for this visit. There is no height or weight on file to calculate BMI.  General: well developed, well nourished, seated, in no evident distress Head: head normocephalic and atraumatic.   Neck: supple with no carotid or supraclavicular bruits Cardiovascular: regular rate and rhythm, no murmurs Musculoskeletal: no deformity Skin:  no rash/petichiae Vascular:  Normal pulses all extremities   Neurologic Exam Mental Status: Awake and fully alert. Oriented to place and time. Recent and remote memory intact. Attention span, concentration and fund of knowledge appropriate. Mood and affect appropriate.  Cranial Nerves: Fundoscopic exam reveals sharp disc margins. Pupils equal, briskly reactive to light. Extraocular movements full without nystagmus. Visual fields full to confrontation. Hearing intact. Facial sensation intact. Face, tongue, palate moves normally and symmetrically.  Motor: Normal bulk and tone. Normal strength in all tested extremity muscles. Sensory.: intact  to touch , pinprick , position and vibratory sensation.  Coordination: Rapid alternating movements normal in all extremities. Finger-to-nose and heel-to-shin performed accurately bilaterally. Gait and Station: Arises from chair without difficulty. Stance is normal. Gait demonstrates normal stride length and balance Reflexes: 1+ and symmetric. Toes downgoing.     Generalized: Well developed, obese female in no acute distress  Head: normocephalic and atraumatic,. Oropharynx benign  Neck: Supple,  Musculoskeletal: Severe scoliosis to the left  Neurological examination   Mentation: Alert severely dysarthric follows all commands    Cranial nerve II-XII: Pupils were equal round reactive to light extraocular movements were full, visual field were full on confrontational test.  Mild left facial weakness . hearing was intact to finger rubbing bilaterally. Uvula tongue midline. head turning and shoulder shrug were normal and symmetric.Tongue protrusion  into cheek strength was normal. 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  Good strength on the right side  Sensory: normal and symmetric to light touch, pinprick, and  Vibration in the upper and lower extremity  Coordination: finger-nose-finger, normal on the right  unable to perform on the left Reflexes: Brisker on the left and right Gait and Station: Wheelchair-bound not ambulated DIAGNOSTIC DATA (LABS, IMAGING, TESTING) - I reviewed patient records, labs, notes, testing and imaging myself where available.  Lab Results  Component Value Date   WBC 5.9 05/29/2017   HGB 12.2 05/29/2017   HCT 35.2 05/29/2017   MCV 86 05/29/2017   PLT 262 05/29/2017      Component Value Date/Time   NA 148 (H) 05/29/2017 1331   K 3.4 (L) 05/29/2017 1331   CL 102 05/29/2017 1331   CO2 26 05/29/2017 1331   GLUCOSE 93 05/29/2017 1331   GLUCOSE 95 08/05/2013 1723   BUN 9 05/29/2017 1331    CREATININE 0.64 05/29/2017 1331   CALCIUM 9.3 05/29/2017 1331   PROT 7.4 05/29/2017 1331   ALBUMIN 4.3 05/29/2017 1331   AST 16 05/29/2017 1331   ALT 10 05/29/2017 1331   ALKPHOS 380 (H) 05/29/2017 1331   BILITOT 0.2 05/29/2017 1331   GFRNONAA 97 05/29/2017 1331   GFRAA 112 05/29/2017 1331        ASSESSMENT AND PLAN 64 y.o. year old female  has a past medical history of Epilepsy, cerebral palsy and myoclonic jerking  who continues to be followed in this office for routine follow-up. No generalized seizure activity in 12 years but intermittent brief myoclonic jerks continue.      Continue Dilantin, Keppra, and Lamictal at current doses (family does not wish to taper Dilantin in the past due to recurrent seizures) Will sign to get recent labs from PCP Dr. Iona Beard Follow-up yearly and as needed  Frann Rider, [facility]  [facility] [address] [place], [place] [address]  Phone [phone] Fax [phone] Note: This document was prepared with digital dictation and possible smart phrase technology. Any transcriptional errors that result from this process are unintentional.

## 2019-06-03 DIAGNOSIS — I10 Essential (primary) hypertension: Secondary | ICD-10-CM | POA: Diagnosis not present

## 2019-06-03 DIAGNOSIS — G809 Cerebral palsy, unspecified: Secondary | ICD-10-CM | POA: Diagnosis not present

## 2019-06-03 DIAGNOSIS — E785 Hyperlipidemia, unspecified: Secondary | ICD-10-CM | POA: Diagnosis not present

## 2019-06-03 DIAGNOSIS — R569 Unspecified convulsions: Secondary | ICD-10-CM | POA: Diagnosis not present

## 2019-07-19 ENCOUNTER — Other Ambulatory Visit: Payer: Self-pay | Admitting: Neurology

## 2019-07-31 DIAGNOSIS — Z23 Encounter for immunization: Secondary | ICD-10-CM | POA: Diagnosis not present

## 2019-08-05 ENCOUNTER — Other Ambulatory Visit: Payer: Medicare Other

## 2019-08-15 ENCOUNTER — Other Ambulatory Visit: Payer: Self-pay | Admitting: Family Medicine

## 2019-08-15 ENCOUNTER — Ambulatory Visit
Admission: RE | Admit: 2019-08-15 | Discharge: 2019-08-15 | Disposition: A | Discharge status: Home / Self Care | Payer: Medicare Other | Source: Ambulatory Visit | Attending: Family Medicine | Admitting: Family Medicine

## 2019-08-15 ENCOUNTER — Other Ambulatory Visit: Payer: Self-pay

## 2019-08-15 DIAGNOSIS — N6310 Unspecified lump in the right breast, unspecified quadrant: Secondary | ICD-10-CM

## 2019-08-15 DIAGNOSIS — N6320 Unspecified lump in the left breast, unspecified quadrant: Secondary | ICD-10-CM

## 2019-08-15 DIAGNOSIS — R928 Other abnormal and inconclusive findings on diagnostic imaging of breast: Secondary | ICD-10-CM

## 2019-08-15 DIAGNOSIS — N6314 Unspecified lump in the right breast, lower inner quadrant: Secondary | ICD-10-CM | POA: Diagnosis not present

## 2019-08-15 DIAGNOSIS — N6312 Unspecified lump in the right breast, upper inner quadrant: Secondary | ICD-10-CM | POA: Diagnosis not present

## 2019-08-15 DIAGNOSIS — N6321 Unspecified lump in the left breast, upper outer quadrant: Secondary | ICD-10-CM | POA: Diagnosis not present

## 2019-08-16 ENCOUNTER — Other Ambulatory Visit: Payer: Self-pay | Admitting: Neurology

## 2019-08-18 NOTE — Telephone Encounter (Signed)
Pt has an appt on 08/20/2019 with Shanda Bumps NP.

## 2019-08-20 ENCOUNTER — Other Ambulatory Visit: Payer: Self-pay

## 2019-08-20 ENCOUNTER — Encounter: Payer: Self-pay | Admitting: Adult Health

## 2019-08-20 ENCOUNTER — Ambulatory Visit (INDEPENDENT_AMBULATORY_CARE_PROVIDER_SITE_OTHER): Payer: Medicare Other | Admitting: Adult Health

## 2019-08-20 VITALS — BP 119/72 | HR 85 | Temp 98.4°F | Ht 60.0 in | Wt 166.0 lb

## 2019-08-20 DIAGNOSIS — E559 Vitamin D deficiency, unspecified: Secondary | ICD-10-CM | POA: Diagnosis not present

## 2019-08-20 DIAGNOSIS — G40219 Localization-related (focal) (partial) symptomatic epilepsy and epileptic syndromes with complex partial seizures, intractable, without status epilepticus: Secondary | ICD-10-CM | POA: Diagnosis not present

## 2019-08-20 DIAGNOSIS — Z5181 Encounter for therapeutic drug level monitoring: Secondary | ICD-10-CM | POA: Diagnosis not present

## 2019-08-20 MED ORDER — DILANTIN 100 MG PO CAPS: ORAL_CAPSULE | ORAL | 3 refills | Status: DC

## 2019-08-20 NOTE — Progress Notes (Signed)
I agree with the above plan 

## 2019-08-20 NOTE — Patient Instructions (Addendum)
Your Plan:  Continue current dosages of phenytoin, lamotrigine and levetiracetam for seizure prevention -refills will be provided  Obtain lab work for routine monitoring with chronic use of seizure medication - we will call with any abnormal results    Follow up in 1 year or call earlier if needed      Thank you for coming to see [place] at [facility]. I hope we have been able to provide you high quality care today.  You may receive a patient satisfaction survey over the next few weeks. We would appreciate your feedback and comments so that we may continue to improve ourselves and the health of our patients.

## 2019-08-20 NOTE — Progress Notes (Signed)
[facility]  PATIENT: [name] DOB: January 01, 1956   REASON FOR VISIT: Follow-up for myoclonus jerks, cerebral palsy and seizure disorder HISTORY FROM: Patient and sister [name]   Chief complaint: Chief Complaint  Patient presents with  . Follow-up    with sister, rm 85  . Seizures      HISTORY OF PRESENT ILLNESS:  Ms. [name] is a 64 year old female who continues to be followed in this office with Dr. Leonie Man long-term for seizure disorder management and monitoring.  She has been seizure-free since 2009.  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  She was previously seen on 05/29/2018 and has remained stable since that time without recurrent seizure activity.  Continues on phenytoin 200 mg daily, lamotrigine 150 mg twice daily and levetiracetam 1500 mg twice daily.  Tolerating all medications well without side effects.  No concerns at this time.        REVIEW OF SYSTEMS: Full 14 system review of systems performed and notable only for those listed, all others are neg:  Constitutional: neg  Cardiovascular: neg Ear/Nose/Throat: Drooling Skin: neg Eyes: neg Respiratory: neg Gastroitestinal: neg  Hematology/Lymphatic: neg  Endocrine: neg Musculoskeletal: Wheelchair-bound Allergy/Immunology: neg Neurological: History of seizure disorder, cerebral palsy Psychiatric: neg Sleep : neg   ALLERGIES: Allergies  Allergen Reactions  . Aspirin   . Benadryl [Diphenhydramine Hcl (Sleep)]   . Codeine   . Phenobarbital     HOME MEDICATIONS: Outpatient Medications Prior to Visit  Medication Sig Dispense Refill  . amLODipine (NORVASC) 5 MG tablet Take 5 mg by mouth daily.    . betamethasone, augmented, (DIPROLENE) 0.05 % lotion Apply topically 2 (two) times daily.    . carvedilol (COREG) 6.25 MG tablet Take 6.25 mg by mouth 2 (two) times daily with a meal.    . DILANTIN 100 MG ER capsule TAKE (1) CAPSULE BY MOUTH TWICE DAILY. 180  capsule 3  . famotidine (PEPCID) 20 MG tablet Take 20 mg by mouth daily.     . hydrochlorothiazide (HYDRODIURIL) 25 MG tablet Take 25 mg by mouth daily. 12.5 mg daily    . hydrOXYzine (ATARAX/VISTARIL) 25 MG tablet     . KEPPRA 750 MG tablet TAKE 2 TABLETS BY MOUTH TWICE DAILY. 120 tablet 0  . LAMICTAL 150 MG tablet TAKE 1 TABLET BY MOUTH TWICE DAILY. 60 tablet 12  . mupirocin ointment (BACTROBAN) 2 % Place 1 application into the nose 2 (two) times daily.    Marland Kitchen nystatin cream (MYCOSTATIN) Apply topically 2 (two) times daily. 30 g 1  . simvastatin (ZOCOR) 10 MG tablet Take 10 mg by mouth at bedtime.     No facility-administered medications prior to visit.    PAST MEDICAL HISTORY: Past Medical History:  Diagnosis Date  . Epilepsy ([place])   . Hypertension   . Seizures ([place])     PAST SURGICAL HISTORY: Past Surgical History:  Procedure Laterality Date  . ABDOMINAL HYSTERECTOMY    . arm surgery    . BACK SURGERY    . BREAST SURGERY     cyst removal    FAMILY HISTORY: Family History  Problem Relation Age of Onset  . Transient ischemic attack Mother     SOCIAL HISTORY: Social History   Socioeconomic History  . Marital status: Single    Spouse name: Not on file  . Number of children: Not on file  . Years of education: Not on file  . Highest education level: Not on file  Occupational History  . Not on file  Tobacco Use  . Smoking status: Never Smoker  . Smokeless tobacco: Never Used  Substance and Sexual Activity  . Alcohol use: No  . Drug use: No  . Sexual activity: Not on file  Other Topics Concern  . Not on file  Social History Narrative   Lives with sister, [name]   Social Determinants of Health   Financial Resource Strain:   . Difficulty of Paying Living Expenses:   Food Insecurity:   . Worried About Programme researcher, broadcasting/film/video in the Last Year:   . Barista in the Last Year:   Transportation Needs:   . Freight forwarder (Medical):   Marland Kitchen Lack of  Transportation (Non-Medical):   Physical Activity:   . Days of Exercise per Week:   . Minutes of Exercise per Session:   Stress:   . Feeling of Stress :   Social Connections:   . Frequency of Communication with Friends and Family:   . Frequency of Social Gatherings with Friends and Family:   . Attends Religious Services:   . Active Member of Clubs or Organizations:   . Attends Banker Meetings:   Marland Kitchen Marital Status:   Intimate Partner Violence:   . Fear of Current or Ex-Partner:   . Emotionally Abused:   Marland Kitchen Physically Abused:   . Sexually Abused:      PHYSICAL EXAM  Vitals:   08/20/19 1557  BP: 119/72  Pulse: 85  Temp: 98.4 F (36.9 C)   There is no height or weight on file to calculate BMI.  General: well developed, well nourished,  pleasant middle-aged African-American female, seated, in no evident distress Head: head normocephalic and atraumatic.   Neck: supple with no carotid or supraclavicular bruits Cardiovascular: regular rate and rhythm, no murmurs Musculoskeletal: no deformity Skin:  no rash/petichiae Vascular:  Normal pulses all extremities   Neurologic Exam Mental Status: Awake and fully alert.   Severely dysarthric.  Oriented to place and time. Recent and remote memory intact. Attention span, concentration and fund of knowledge appropriate. Mood and affect appropriate.  Cranial Nerves: Fundoscopic exam reveals sharp disc margins. Pupils equal, briskly reactive to light. Extraocular movements full without nystagmus. Visual fields full to confrontation. Hearing intact. Facial sensation intact.  Mild left lower facial weakness.  Tongue, and palate moves normally and symmetrically.  Motor: Normal strength right upper and lower extremity. 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 Sensory.: intact to touch , pinprick , position and vibratory sensation.  Coordination: Rapid alternating movements  normal in all extremities on the right. Finger-to-nose and heel-to-shin performed accurately on right. Gait and Station: Deferred as patient wheelchair-bound Reflexes: 1+ and symmetric. Toes downgoing.         ASSESSMENT AND PLAN 64 y.o. year old female  has a past medical history of Epilepsy who continues to be followed for ongoing monitoring and management.  No generalized seizure activity in 12 years but intermittent brief myoclonic jerks continue which have been stable.      Continue Dilantin 100 mg twice daily, Keppra 1500 mg twice daily, and Lamictal 150 mg twice daily -refills provided We will obtain lab work for monitoring of chronic medication use   Follow-up yearly and as needed   I spent 22 minutes of face-to-face and non-face-to-face time with patient and sister.  This included previsit chart review, lab review, study review, order entry, electronic health record documentation,  patient education   Ihor Austin, [facility]  [facility] [address] [place], [place] [address]  Phone [phone] Fax [phone] Note: This document was prepared with digital dictation and possible smart phrase technology. Any transcriptional errors that result from this process are unintentional.

## 2019-08-21 ENCOUNTER — Telehealth: Payer: Self-pay

## 2019-08-21 LAB — CBC
Hematocrit: 38.2 % (ref 34.0–46.6)
Hemoglobin: 13 g/dL (ref 11.1–15.9)
MCH: 30.4 pg (ref 26.6–33.0)
MCHC: 34 g/dL (ref 31.5–35.7)
MCV: 90 fL (ref 79–97)
Platelets: 284 10*3/uL (ref 150–450)
RBC: 4.27 x10E6/uL (ref 3.77–5.28)
RDW: 12.2 % (ref 11.7–15.4)
WBC: 5.2 10*3/uL (ref 3.4–10.8)

## 2019-08-21 LAB — COMPREHENSIVE METABOLIC PANEL
ALT: 7 IU/L (ref 0–32)
AST: 18 IU/L (ref 0–40)
Albumin/Globulin Ratio: 1.5 (ref 1.2–2.2)
Albumin: 4.6 g/dL (ref 3.8–4.8)
Alkaline Phosphatase: 372 IU/L — ABNORMAL HIGH (ref 39–117)
BUN/Creatinine Ratio: 23 (ref 12–28)
BUN: 15 mg/dL (ref 8–27)
Bilirubin Total: 0.2 mg/dL (ref 0.0–1.2)
CO2: 27 mmol/L (ref 20–29)
Calcium: 9.6 mg/dL (ref 8.7–10.3)
Chloride: 102 mmol/L (ref 96–106)
Creatinine, Ser: 0.64 mg/dL (ref 0.57–1.00)
GFR calc Af Amer: 110 mL/min/{1.73_m2} (ref >59)
GFR calc non Af Amer: 95 mL/min/{1.73_m2} (ref >59)
Globulin, Total: 3 g/dL (ref 1.5–4.5)
Glucose: 89 mg/dL (ref 65–99)
Potassium: 3.8 mmol/L (ref 3.5–5.2)
Sodium: 144 mmol/L (ref 134–144)
Total Protein: 7.6 g/dL (ref 6.0–8.5)

## 2019-08-21 LAB — LAMOTRIGINE LEVEL: Lamotrigine Lvl: 13 ug/mL (ref 2.0–20.0)

## 2019-08-21 LAB — PHENYTOIN LEVEL, TOTAL: Phenytoin (Dilantin), Serum: 22.2 ug/mL (ref 10.0–20.0)

## 2019-08-21 LAB — VITAMIN D 25 HYDROXY (VIT D DEFICIENCY, FRACTURES): Vit D, 25-Hydroxy: 9.3 ng/mL — ABNORMAL LOW (ref 30.0–100.0)

## 2019-08-21 NOTE — Telephone Encounter (Signed)
Pt verified by name and DOB,  results given to sister, voiced understanding all question answered.  Results faxed to PCP

## 2019-08-22 DIAGNOSIS — Z23 Encounter for immunization: Secondary | ICD-10-CM | POA: Diagnosis not present

## 2019-09-08 DIAGNOSIS — I10 Essential (primary) hypertension: Secondary | ICD-10-CM | POA: Diagnosis not present

## 2019-09-08 DIAGNOSIS — E559 Vitamin D deficiency, unspecified: Secondary | ICD-10-CM | POA: Diagnosis not present

## 2019-09-08 DIAGNOSIS — R569 Unspecified convulsions: Secondary | ICD-10-CM | POA: Diagnosis not present

## 2019-09-08 DIAGNOSIS — L89219 Pressure ulcer of right hip, unspecified stage: Secondary | ICD-10-CM | POA: Diagnosis not present

## 2019-11-20 DIAGNOSIS — G809 Cerebral palsy, unspecified: Secondary | ICD-10-CM | POA: Diagnosis not present

## 2019-11-28 DIAGNOSIS — I10 Essential (primary) hypertension: Secondary | ICD-10-CM | POA: Diagnosis not present

## 2019-11-28 DIAGNOSIS — G40909 Epilepsy, unspecified, not intractable, without status epilepticus: Secondary | ICD-10-CM | POA: Diagnosis not present

## 2019-11-28 DIAGNOSIS — E785 Hyperlipidemia, unspecified: Secondary | ICD-10-CM | POA: Diagnosis not present

## 2019-12-05 DIAGNOSIS — E559 Vitamin D deficiency, unspecified: Secondary | ICD-10-CM | POA: Diagnosis not present

## 2019-12-10 ENCOUNTER — Telehealth: Payer: Self-pay

## 2019-12-10 NOTE — Telephone Encounter (Signed)
I have submitted a PA request for Keppra 750mg  2 tabs BID on CMM, Key: - PA Case ID: [id number].  Your information has been submitted to [employer]. [employer] will review the request and will issue a decision, typically within 1-3 days from your submission. You can check the updated outcome later by reopening this request.  If [employer] has not responded in 1-3 days or if you have any questions about your ePA request, please contact [employer] at [phone].

## 2019-12-10 NOTE — Telephone Encounter (Signed)
I have submitted a PA request for Lamictal 150mg  BID on CMM, Key: [id number] - PA Case ID: .  Your information has been submitted to [employer]. [employer] will review the request and will issue a decision, typically within 1-3 days from your submission. You can check the updated outcome later by reopening this request.  If [employer] has not responded in 1-3 days or if you have any questions about your ePA request, please contact [employer] at [phone].

## 2019-12-17 NOTE — Telephone Encounter (Addendum)
Received fax from [employer] :Keppra approved 09/11/19 - 12/09/2020. Lamictal approved 09/11/19 - 12/09/2020. Approval letters faxed to [facility].

## 2019-12-25 DIAGNOSIS — G809 Cerebral palsy, unspecified: Secondary | ICD-10-CM | POA: Diagnosis not present

## 2019-12-25 DIAGNOSIS — Z4689 Encounter for fitting and adjustment of other specified devices: Secondary | ICD-10-CM | POA: Diagnosis not present

## 2019-12-25 DIAGNOSIS — Z993 Dependence on wheelchair: Secondary | ICD-10-CM | POA: Diagnosis not present

## 2020-01-12 DIAGNOSIS — R569 Unspecified convulsions: Secondary | ICD-10-CM | POA: Diagnosis not present

## 2020-01-12 DIAGNOSIS — G809 Cerebral palsy, unspecified: Secondary | ICD-10-CM | POA: Diagnosis not present

## 2020-01-12 DIAGNOSIS — I10 Essential (primary) hypertension: Secondary | ICD-10-CM | POA: Diagnosis not present

## 2020-01-12 DIAGNOSIS — E559 Vitamin D deficiency, unspecified: Secondary | ICD-10-CM | POA: Diagnosis not present

## 2020-01-12 DIAGNOSIS — Z23 Encounter for immunization: Secondary | ICD-10-CM | POA: Diagnosis not present

## 2020-01-12 DIAGNOSIS — E785 Hyperlipidemia, unspecified: Secondary | ICD-10-CM | POA: Diagnosis not present

## 2020-01-15 DIAGNOSIS — H0102A Squamous blepharitis right eye, upper and lower eyelids: Secondary | ICD-10-CM | POA: Diagnosis not present

## 2020-01-15 DIAGNOSIS — H401134 Primary open-angle glaucoma, bilateral, indeterminate stage: Secondary | ICD-10-CM | POA: Diagnosis not present

## 2020-01-15 DIAGNOSIS — H04123 Dry eye syndrome of bilateral lacrimal glands: Secondary | ICD-10-CM | POA: Diagnosis not present

## 2020-01-15 DIAGNOSIS — H0102B Squamous blepharitis left eye, upper and lower eyelids: Secondary | ICD-10-CM | POA: Diagnosis not present

## 2020-01-15 DIAGNOSIS — H2513 Age-related nuclear cataract, bilateral: Secondary | ICD-10-CM | POA: Diagnosis not present

## 2020-01-29 DIAGNOSIS — I10 Essential (primary) hypertension: Secondary | ICD-10-CM | POA: Diagnosis not present

## 2020-01-29 DIAGNOSIS — E785 Hyperlipidemia, unspecified: Secondary | ICD-10-CM | POA: Diagnosis not present

## 2020-01-29 DIAGNOSIS — G40909 Epilepsy, unspecified, not intractable, without status epilepticus: Secondary | ICD-10-CM | POA: Diagnosis not present

## 2020-02-02 ENCOUNTER — Ambulatory Visit
Admission: RE | Admit: 2020-02-02 | Discharge: 2020-02-02 | Disposition: A | Discharge status: Home / Self Care | Payer: Medicare Other | Source: Ambulatory Visit | Attending: Family Medicine | Admitting: Family Medicine

## 2020-02-02 ENCOUNTER — Other Ambulatory Visit: Payer: Self-pay

## 2020-02-02 DIAGNOSIS — R928 Other abnormal and inconclusive findings on diagnostic imaging of breast: Secondary | ICD-10-CM

## 2020-02-02 DIAGNOSIS — N6310 Unspecified lump in the right breast, unspecified quadrant: Secondary | ICD-10-CM

## 2020-02-02 DIAGNOSIS — N6312 Unspecified lump in the right breast, upper inner quadrant: Secondary | ICD-10-CM | POA: Diagnosis not present

## 2020-02-02 DIAGNOSIS — N6320 Unspecified lump in the left breast, unspecified quadrant: Secondary | ICD-10-CM

## 2020-02-02 DIAGNOSIS — N6321 Unspecified lump in the left breast, upper outer quadrant: Secondary | ICD-10-CM | POA: Diagnosis not present

## 2020-02-02 DIAGNOSIS — R922 Inconclusive mammogram: Secondary | ICD-10-CM | POA: Diagnosis not present

## 2020-02-02 DIAGNOSIS — N6314 Unspecified lump in the right breast, lower inner quadrant: Secondary | ICD-10-CM | POA: Diagnosis not present

## 2020-03-30 DIAGNOSIS — E785 Hyperlipidemia, unspecified: Secondary | ICD-10-CM | POA: Diagnosis not present

## 2020-03-30 DIAGNOSIS — I10 Essential (primary) hypertension: Secondary | ICD-10-CM | POA: Diagnosis not present

## 2020-03-30 DIAGNOSIS — G40909 Epilepsy, unspecified, not intractable, without status epilepticus: Secondary | ICD-10-CM | POA: Diagnosis not present

## 2020-04-03 ENCOUNTER — Other Ambulatory Visit: Payer: Self-pay | Admitting: Neurology

## 2020-04-16 DIAGNOSIS — Z23 Encounter for immunization: Secondary | ICD-10-CM | POA: Diagnosis not present

## 2020-04-30 DIAGNOSIS — G40909 Epilepsy, unspecified, not intractable, without status epilepticus: Secondary | ICD-10-CM | POA: Diagnosis not present

## 2020-04-30 DIAGNOSIS — I10 Essential (primary) hypertension: Secondary | ICD-10-CM | POA: Diagnosis not present

## 2020-04-30 DIAGNOSIS — E785 Hyperlipidemia, unspecified: Secondary | ICD-10-CM | POA: Diagnosis not present

## 2020-05-14 ENCOUNTER — Other Ambulatory Visit: Payer: Self-pay | Admitting: Adult Health

## 2020-05-24 DIAGNOSIS — G809 Cerebral palsy, unspecified: Secondary | ICD-10-CM | POA: Diagnosis not present

## 2020-08-09 ENCOUNTER — Other Ambulatory Visit: Payer: Self-pay | Admitting: Adult Health

## 2020-08-10 DIAGNOSIS — E785 Hyperlipidemia, unspecified: Secondary | ICD-10-CM | POA: Diagnosis not present

## 2020-08-10 DIAGNOSIS — G8194 Hemiplegia, unspecified affecting left nondominant side: Secondary | ICD-10-CM | POA: Diagnosis not present

## 2020-08-10 DIAGNOSIS — R569 Unspecified convulsions: Secondary | ICD-10-CM | POA: Diagnosis not present

## 2020-08-10 DIAGNOSIS — E559 Vitamin D deficiency, unspecified: Secondary | ICD-10-CM | POA: Diagnosis not present

## 2020-08-10 DIAGNOSIS — G809 Cerebral palsy, unspecified: Secondary | ICD-10-CM | POA: Diagnosis not present

## 2020-08-10 DIAGNOSIS — I10 Essential (primary) hypertension: Secondary | ICD-10-CM | POA: Diagnosis not present

## 2020-08-19 ENCOUNTER — Ambulatory Visit (INDEPENDENT_AMBULATORY_CARE_PROVIDER_SITE_OTHER): Payer: Medicare Other | Admitting: Adult Health

## 2020-08-19 ENCOUNTER — Encounter: Payer: Self-pay | Admitting: Adult Health

## 2020-08-19 VITALS — BP 120/66 | HR 72

## 2020-08-19 DIAGNOSIS — G40219 Localization-related (focal) (partial) symptomatic epilepsy and epileptic syndromes with complex partial seizures, intractable, without status epilepticus: Secondary | ICD-10-CM | POA: Diagnosis not present

## 2020-08-19 DIAGNOSIS — Z5181 Encounter for therapeutic drug level monitoring: Secondary | ICD-10-CM

## 2020-08-19 MED ORDER — KEPPRA 750 MG PO TABS: 1500.0000 mg | ORAL_TABLET | Freq: Two times a day (BID) | ORAL | 3 refills | Status: DC

## 2020-08-19 MED ORDER — LAMICTAL 150 MG PO TABS: 150.0000 mg | ORAL_TABLET | Freq: Two times a day (BID) | ORAL | 3 refills | Status: DC

## 2020-08-19 MED ORDER — DILANTIN 100 MG PO CAPS: 100.0000 mg | ORAL_CAPSULE | Freq: Two times a day (BID) | ORAL | 3 refills | Status: DC

## 2020-08-19 NOTE — Progress Notes (Signed)
[facility]  PATIENT: [name] DOB: Sep 08, 1955   REASON FOR VISIT: Follow-up for myoclonus jerks, cerebral palsy and seizure disorder HISTORY FROM: Patient and sister [name]   Chief complaint: Chief Complaint  Patient presents with  . Follow-up    RM 14 with sister (joyce) Pt is well, no seizures. Sister states she has "jerks" every so often.       HISTORY OF PRESENT ILLNESS:  Ms. [name] is a 65 year old female who continues to be followed in this office with Dr. Pearlean Brownie for long-term for seizure disorder management and monitoring.  She has been seizure-free since 2009.  Occasional myoclonic jerks which are chronic and have not progressed  She was previously seen on 08/20/2019 and returns today, 08/19/2020, for yearly follow-up.  She has been doing well since prior visit without any reoccurring seizure activity.  Reports compliance on phenytoin 200 mg daily, lamotrigine 150 mg twice daily and levetiracetam 1500 mg twice daily.  Tolerating all medications well without side effects.  Reports recent lab work obtained by PCP -unable to view via epic.  Lab work completed 1 year prior satisfactory for patient.  No concerns at this time.    REVIEW OF SYSTEMS: Full 14 system review of systems performed and notable only for those listed, all others are neg:  Constitutional: neg  Cardiovascular: neg Ear/Nose/Throat: Drooling Skin: neg Eyes: neg Respiratory: neg Gastroitestinal: neg  Hematology/Lymphatic: neg  Endocrine: neg Musculoskeletal: Wheelchair-bound Allergy/Immunology: neg Neurological: History of seizure disorder, cerebral palsy Psychiatric: neg Sleep : neg   ALLERGIES: Allergies  Allergen Reactions  . Aspirin   . Benadryl [Diphenhydramine Hcl (Sleep)]   . Codeine   . Phenobarbital     HOME MEDICATIONS: Outpatient Medications Prior to Visit  Medication Sig Dispense Refill  . amLODipine (NORVASC) 5 MG tablet Take 5 mg by mouth daily.    .  betamethasone, augmented, (DIPROLENE) 0.05 % lotion Apply topically 2 (two) times daily.    . carvedilol (COREG) 6.25 MG tablet Take 6.25 mg by mouth 2 (two) times daily with a meal.    . DILANTIN 100 MG ER capsule TAKE (1) CAPSULE BY MOUTH TWICE DAILY. 180 capsule 0  . famotidine (PEPCID) 20 MG tablet Take 20 mg by mouth daily.     . hydrochlorothiazide (HYDRODIURIL) 25 MG tablet Take 25 mg by mouth daily. 12.5 mg daily    . hydrOXYzine (ATARAX/VISTARIL) 25 MG tablet     . KEPPRA 750 MG tablet TAKE 2 TABLETS BY MOUTH TWICE DAILY. 360 tablet 0  . LAMICTAL 150 MG tablet TAKE 1 TABLET BY MOUTH TWICE DAILY. 180 tablet 1  . mupirocin ointment (BACTROBAN) 2 % Place 1 application into the nose 2 (two) times daily.    Marland Kitchen nystatin cream (MYCOSTATIN) Apply topically 2 (two) times daily. 30 g 1  . simvastatin (ZOCOR) 10 MG tablet Take 10 mg by mouth at bedtime.     No facility-administered medications prior to visit.    PAST MEDICAL HISTORY: Past Medical History:  Diagnosis Date  . Epilepsy ([facility])   . Hypertension   . Seizures ([facility])     PAST SURGICAL HISTORY: Past Surgical History:  Procedure Laterality Date  . ABDOMINAL HYSTERECTOMY    . arm surgery    . BACK SURGERY    . BREAST SURGERY     cyst removal    FAMILY HISTORY: Family History  Problem Relation Age of Onset  . Transient ischemic attack Mother     SOCIAL HISTORY:  Social History   Socioeconomic History  . Marital status: Single    Spouse name: Not on file  . Number of children: Not on file  . Years of education: Not on file  . Highest education level: Not on file  Occupational History  . Not on file  Tobacco Use  . Smoking status: Never Smoker  . Smokeless tobacco: Never Used  Substance and Sexual Activity  . Alcohol use: No  . Drug use: No  . Sexual activity: Not on file  Other Topics Concern  . Not on file  Social History Narrative   Lives with sister, [name]   Social Determinants of Health   Financial  Resource Strain: Not on file  Food Insecurity: Not on file  Transportation Needs: Not on file  Physical Activity: Not on file  Stress: Not on file  Social Connections: Not on file  Intimate Partner Violence: Not on file     PHYSICAL EXAM Today's Vitals   08/19/20 1536  BP: 120/66  Pulse: 72   There is no height or weight on file to calculate BMI.   General: well developed, well nourished, pleasant middle-aged African-American female, seated, in no evident distress Head: head normocephalic and atraumatic.   Neck: supple with no carotid or supraclavicular bruits Cardiovascular: regular rate and rhythm, no murmurs Musculoskeletal: CP Skin:  no rash/petichiae Vascular:  Normal pulses all extremities   Neurologic Exam Mental Status: Awake and fully alert.   Severely dysarthric.  Oriented to place and time. Recent and remote memory intact. Attention span, concentration and fund of knowledge appropriate. Mood and affect appropriate.  Cranial Nerves: Pupils equal, briskly reactive to light. Extraocular movements full without nystagmus. Visual fields full to confrontation. Hearing intact. Facial sensation intact.  Mild left lower facial weakness.  Tongue, and palate moves normally and symmetrically.  Motor: Normal strength right upper and lower extremity. 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 Sensory.: intact to touch , pinprick , position and vibratory sensation.  Coordination: Rapid alternating movements normal in all extremities on the right.  Gait and Station: Deferred as patient wheelchair-bound Reflexes: 1+ and symmetric. Toes downgoing.         ASSESSMENT AND PLAN 65 y.o. year old female  has a past medical history of Epilepsy who continues to be followed for ongoing monitoring and management.  No generalized seizure activity in 13 years but intermittent brief myoclonic jerks continue which have been stable.      Continue Dilantin 100 mg twice daily, Keppra 1500 mg twice daily, and Lamictal 150 mg twice daily -refills provided Received recent lab work from PCP office which was obtained on 4/12 Phenytoin level 19.4 CMP and CBC within normal limits Vitamin D 32  Will obtain lamotrigine level today   Follow-up in 1 year or call earlier if needed   CC:  [facility] provider: Dr. Michelene Gardener, Earvin Hansen, MD    I spent 25 minutes of face-to-face and non-face-to-face time with patient and sister.  This included previsit chart review, lab review, study review, order entry, electronic health record documentation, patient education and discussion regarding history of seizures and ongoing use of AEDs, obtaining lab work recently completed by PCP and further review and answered all other questions to patient and sister satisfaction   Ihor Austin, [facility]  [facility] [address] [place], [place] [address]  Phone [phone] Fax [phone] Note: This document was prepared with digital dictation and possible smart phrase technology.  Any transcriptional errors that result from this process are unintentional.

## 2020-08-19 NOTE — Patient Instructions (Addendum)
Your Plan:  Continue current treatment plan   We will check lamotrigine level today to ensure satisfactory management  Request recent lab work obtained by PCP to be faxed to our office for further review    Follow up in 1 year or call earlier if needed    Thank you for coming to see [place] at [facility]. I hope we have been able to provide you high quality care today.  You may receive a patient satisfaction survey over the next few weeks. We would appreciate your feedback and comments so that we may continue to improve ourselves and the health of our patients.

## 2020-08-20 LAB — LAMOTRIGINE LEVEL: Lamotrigine Lvl: 9.9 ug/mL (ref 2.0–20.0)

## 2020-08-23 NOTE — Progress Notes (Signed)
I agree with the above plan 

## 2020-08-28 DIAGNOSIS — G40909 Epilepsy, unspecified, not intractable, without status epilepticus: Secondary | ICD-10-CM | POA: Diagnosis not present

## 2020-08-28 DIAGNOSIS — I10 Essential (primary) hypertension: Secondary | ICD-10-CM | POA: Diagnosis not present

## 2020-08-28 DIAGNOSIS — E785 Hyperlipidemia, unspecified: Secondary | ICD-10-CM | POA: Diagnosis not present

## 2020-09-09 DIAGNOSIS — Z23 Encounter for immunization: Secondary | ICD-10-CM | POA: Diagnosis not present

## 2020-09-28 DIAGNOSIS — I10 Essential (primary) hypertension: Secondary | ICD-10-CM | POA: Diagnosis not present

## 2020-09-28 DIAGNOSIS — E785 Hyperlipidemia, unspecified: Secondary | ICD-10-CM | POA: Diagnosis not present

## 2020-09-28 DIAGNOSIS — G40909 Epilepsy, unspecified, not intractable, without status epilepticus: Secondary | ICD-10-CM | POA: Diagnosis not present

## 2020-11-28 DIAGNOSIS — I10 Essential (primary) hypertension: Secondary | ICD-10-CM | POA: Diagnosis not present

## 2020-11-28 DIAGNOSIS — G40909 Epilepsy, unspecified, not intractable, without status epilepticus: Secondary | ICD-10-CM | POA: Diagnosis not present

## 2020-11-28 DIAGNOSIS — E785 Hyperlipidemia, unspecified: Secondary | ICD-10-CM | POA: Diagnosis not present

## 2020-12-20 DIAGNOSIS — E785 Hyperlipidemia, unspecified: Secondary | ICD-10-CM | POA: Diagnosis not present

## 2020-12-20 DIAGNOSIS — E559 Vitamin D deficiency, unspecified: Secondary | ICD-10-CM | POA: Diagnosis not present

## 2020-12-20 DIAGNOSIS — I10 Essential (primary) hypertension: Secondary | ICD-10-CM | POA: Diagnosis not present

## 2020-12-20 DIAGNOSIS — G8194 Hemiplegia, unspecified affecting left nondominant side: Secondary | ICD-10-CM | POA: Diagnosis not present

## 2020-12-20 DIAGNOSIS — R569 Unspecified convulsions: Secondary | ICD-10-CM | POA: Diagnosis not present

## 2020-12-20 DIAGNOSIS — Z993 Dependence on wheelchair: Secondary | ICD-10-CM | POA: Diagnosis not present

## 2020-12-20 DIAGNOSIS — K219 Gastro-esophageal reflux disease without esophagitis: Secondary | ICD-10-CM | POA: Diagnosis not present

## 2020-12-20 DIAGNOSIS — G809 Cerebral palsy, unspecified: Secondary | ICD-10-CM | POA: Diagnosis not present

## 2021-01-17 DIAGNOSIS — J069 Acute upper respiratory infection, unspecified: Secondary | ICD-10-CM | POA: Diagnosis not present

## 2021-01-25 DIAGNOSIS — J219 Acute bronchiolitis, unspecified: Secondary | ICD-10-CM | POA: Diagnosis not present

## 2021-01-28 DIAGNOSIS — G40909 Epilepsy, unspecified, not intractable, without status epilepticus: Secondary | ICD-10-CM | POA: Diagnosis not present

## 2021-01-28 DIAGNOSIS — E785 Hyperlipidemia, unspecified: Secondary | ICD-10-CM | POA: Diagnosis not present

## 2021-01-28 DIAGNOSIS — I10 Essential (primary) hypertension: Secondary | ICD-10-CM | POA: Diagnosis not present

## 2021-02-08 ENCOUNTER — Telehealth: Payer: Self-pay | Admitting: *Deleted

## 2021-02-08 ENCOUNTER — Telehealth: Payer: Self-pay | Admitting: Adult Health

## 2021-02-08 NOTE — Telephone Encounter (Signed)
Pt's sister,Joyce Blackstock request refill for KEPPRA 750 MG tablet at [facility]

## 2021-02-08 NOTE — Telephone Encounter (Signed)
Lamictal PA, key: [id number], [id number]. Approved, non-formulary 05/01/20-02/08/2022. Approval letter faxed to pharmacy. Received confirmation.

## 2021-02-08 NOTE — Telephone Encounter (Signed)
Contacted pt sister back, informed her that we gave her a yrs supply in April, she sated pharmacy said they needed a PA for this medication, which we hadn't received. Will submit PA via CMM.

## 2021-02-09 ENCOUNTER — Telehealth: Payer: Self-pay

## 2021-02-09 NOTE — Telephone Encounter (Signed)
This approval authorizes your coverage from 05/01/2020 - 02/09/2022

## 2021-02-09 NOTE — Telephone Encounter (Signed)
Contacted sister back, informed her that PA was approved for keppra. She asked me about Lamictal, informed her that it was also approved. Advised to contact pharmacy for refill she understood and was appreciative.

## 2021-02-09 NOTE — Telephone Encounter (Signed)
PA submitted via CMM: Key: [id number] - PA Case ID: [id number] - Rx #: [id number] [employer] will review the request and will issue a decision, typically within 1-3 days from your submission.

## 2021-02-09 NOTE — Telephone Encounter (Signed)
PA approved, see telephone note 02-09-21

## 2021-02-25 DIAGNOSIS — Z23 Encounter for immunization: Secondary | ICD-10-CM | POA: Diagnosis not present

## 2021-02-28 DIAGNOSIS — E785 Hyperlipidemia, unspecified: Secondary | ICD-10-CM | POA: Diagnosis not present

## 2021-02-28 DIAGNOSIS — I10 Essential (primary) hypertension: Secondary | ICD-10-CM | POA: Diagnosis not present

## 2021-02-28 DIAGNOSIS — G40909 Epilepsy, unspecified, not intractable, without status epilepticus: Secondary | ICD-10-CM | POA: Diagnosis not present

## 2021-03-14 ENCOUNTER — Other Ambulatory Visit: Payer: Self-pay | Admitting: Family Medicine

## 2021-03-14 DIAGNOSIS — Z87898 Personal history of other specified conditions: Secondary | ICD-10-CM

## 2021-03-30 DIAGNOSIS — G40909 Epilepsy, unspecified, not intractable, without status epilepticus: Secondary | ICD-10-CM | POA: Diagnosis not present

## 2021-03-30 DIAGNOSIS — I10 Essential (primary) hypertension: Secondary | ICD-10-CM | POA: Diagnosis not present

## 2021-03-30 DIAGNOSIS — E785 Hyperlipidemia, unspecified: Secondary | ICD-10-CM | POA: Diagnosis not present

## 2021-04-29 ENCOUNTER — Other Ambulatory Visit: Payer: Medicare Other

## 2021-05-09 ENCOUNTER — Other Ambulatory Visit: Payer: Self-pay | Admitting: Adult Health

## 2021-05-12 ENCOUNTER — Telehealth: Payer: Self-pay | Admitting: *Deleted

## 2021-05-12 NOTE — Telephone Encounter (Signed)
Lamictal PA, key [id number]. Has been on brand since 2018. Your information has been sent to [facility].

## 2021-05-12 NOTE — Telephone Encounter (Signed)
Brand lamictal approved as non formulary through 04/30/2022. Approval letter faxed to pharmacy.

## 2021-05-12 NOTE — Telephone Encounter (Signed)
Keppra brand PA, key [id number].  Your information has been sent to [facility].

## 2021-05-13 ENCOUNTER — Other Ambulatory Visit: Payer: Self-pay | Admitting: Psychiatry

## 2021-05-13 MED ORDER — KEPPRA 750 MG PO TABS: 1500.0000 mg | ORAL_TABLET | Freq: Two times a day (BID) | ORAL | 3 refills | Status: DC

## 2021-05-16 ENCOUNTER — Telehealth: Payer: Self-pay | Admitting: Adult Health

## 2021-05-16 NOTE — Telephone Encounter (Signed)
Called pharmacy, spoke with [facility] and informed her per sister, patient doesn't have keppra to take tonight, and PA not approved. Beth stated her Sister called for temporary cash Rx. Beth stated with patient's new insurance they allow a 30 day supply until PA is settled. Beth stated they have given her a 30 day supply and delivered it to patient today.

## 2021-05-16 NOTE — Telephone Encounter (Signed)
See phone note dated 05/12/21.

## 2021-05-16 NOTE — Telephone Encounter (Signed)
Pt's sister has called to report that pt only has enough medication to last her for this morning, nothing for afternoon dose.  Pt 's sister states the insurance is waiting on information from Dr Pearlean Brownie before they go about approving the refill.  Please call

## 2021-05-16 NOTE — Telephone Encounter (Signed)
Urgent Appeal letter signed, faxed along with first office note dated 03/01/2007, most recent office note and denial letter.

## 2021-05-16 NOTE — Telephone Encounter (Addendum)
Keppra brand drug denied, non-formulary: must have reasons why patient cannot take levetiracetam, Roweepra 500 mg or Spritam. Patient has been on brand Keppra since it was started in 2008. Called [facility]  for expedited appeal. Patient ID [id number]. Spoke with [place] who transferred me to [facility], [phone], spoke with Wille Glaser who stated I need to call pharmacy, [phone]. Reached [facility] pharmacy help desk, spoke with Butch Penny who stated I need to talk to providers services appeals dept, [phone], reached [facility] spoke with Verdis Frederickson who stated I need to call commercial , gave me new # to call. It is same # I had called earlier, [phone]. I will fill out paper PA that we received and fax requesting expedited appeal. Called pharmacy, spoke with [facility] who stated the patient has [facility] insurance, and she has always been on brand Keppra, due for refill. She gave me [phone], pharmacy help desk. Called # and reached [facility] pharmacy help desk, spoke with Mliss Fritz who started urgent appeal new case [id number] and advised to send additional information and attach to that PA. Appeal letter written, on NP desk for signature.

## 2021-05-18 ENCOUNTER — Telehealth: Payer: Self-pay | Admitting: Adult Health

## 2021-05-18 NOTE — Telephone Encounter (Signed)
Called [facility] pharmacy help line, spoke with Sharyn Lull who gave me appeals dept.  [phone]. She stated expedited appeal can take 72 hours. Called appeals  #, reached [facility], spoke with  Sharyn Lull who stated there is not an outcome yet, decision due tomorrow. Decision will be faxed. Called sister Blanch Media and advised her of above. She asked for a call back when we get decision, verbalized understanding, appreciation.

## 2021-05-18 NOTE — Telephone Encounter (Signed)
[employer] KEPPRA 750 MG tablet was denied. Because information provided insufficient to support for medical necessity. Following required information was not provided and or was not clarified. Diagnosis include treatment of partial onset seizures. Adjunctive therapy for the treatment of myoclonic seizures, adjunctive therapy for the treatment of primary generalized chronic seizures,  Second specific medical reason why you are unable to use Levetiracetam, Roweepra 500 mg or Spritam. Can physician do another PA in order for pt to can get her medication approved.  Would like for nurse to call pt to provide information on PA. Patient contact info: [phone]

## 2021-05-18 NOTE — Telephone Encounter (Signed)
Pt's sister has been provided the last update from 1-16 from [place], [place].  Pt's sister is stating that she is being told there is something that the insurance company is waiting on from our office as to what the hold up is. The sister is not wanting to have to go thru this again at the end of this current 30 day amount. Please call Marva Panda [phone], she doesn't want to run out of time to change pt's insurance if that needs to be done.

## 2021-05-18 NOTE — Telephone Encounter (Signed)
Refer to phone note dated 05/12/21. Appeal is in process, decision due tomorrow.

## 2021-05-19 NOTE — Telephone Encounter (Signed)
Called [facility] Appeals line, spoke with Tremaine who stated Keppra was approved, PA case ID# [id number]. Letter will be generated of approval and mailed to patient. She did not have effective dates.   Called sister and LVM to advise her of approval.

## 2021-05-24 DIAGNOSIS — E785 Hyperlipidemia, unspecified: Secondary | ICD-10-CM | POA: Diagnosis not present

## 2021-05-24 DIAGNOSIS — I10 Essential (primary) hypertension: Secondary | ICD-10-CM | POA: Diagnosis not present

## 2021-05-24 DIAGNOSIS — Z23 Encounter for immunization: Secondary | ICD-10-CM | POA: Diagnosis not present

## 2021-05-24 DIAGNOSIS — R569 Unspecified convulsions: Secondary | ICD-10-CM | POA: Diagnosis not present

## 2021-05-24 DIAGNOSIS — K219 Gastro-esophageal reflux disease without esophagitis: Secondary | ICD-10-CM | POA: Diagnosis not present

## 2021-05-24 DIAGNOSIS — E559 Vitamin D deficiency, unspecified: Secondary | ICD-10-CM | POA: Diagnosis not present

## 2021-05-24 DIAGNOSIS — G808 Other cerebral palsy: Secondary | ICD-10-CM | POA: Diagnosis not present

## 2021-05-25 NOTE — Telephone Encounter (Signed)
Pt's sister called back wanting to know if the pt's DILANTIN 100 MG ER capsule would also need a PA as well. Please discuss in call back for the Keppra medication.

## 2021-05-25 NOTE — Telephone Encounter (Signed)
Called [employer], spoke with Mardene Celeste and requested keppra approval letter mailed to patient and copy faxed to office.  Approval auth # [id number], effective dates 05/12/21-04/30/2022.  She then checked formulary for dilantin 100 mg ER is covered under tier 3 formulary. Called pharmacy, spoke with Vaughan Basta and advised her of above Keppra auth #, dates. She verbalized understanding, appreciation. Called [employer] and advised her of Keppra approval and Dilantin on formulary. She  verbalized understanding, appreciation.

## 2021-05-25 NOTE — Telephone Encounter (Signed)
Pt's sister called stating that Vaughan Basta from [place] is needing the PA for the pt's [place] faxed over to her at  Fax# [phone] Please advise.

## 2021-06-02 ENCOUNTER — Other Ambulatory Visit: Payer: Medicare Other

## 2021-06-24 ENCOUNTER — Other Ambulatory Visit: Payer: Self-pay | Admitting: Family Medicine

## 2021-06-24 DIAGNOSIS — Z87898 Personal history of other specified conditions: Secondary | ICD-10-CM

## 2021-06-24 DIAGNOSIS — N63 Unspecified lump in unspecified breast: Secondary | ICD-10-CM

## 2021-06-24 DIAGNOSIS — Z09 Encounter for follow-up examination after completed treatment for conditions other than malignant neoplasm: Secondary | ICD-10-CM

## 2021-06-27 ENCOUNTER — Ambulatory Visit
Admission: RE | Admit: 2021-06-27 | Discharge: 2021-06-27 | Disposition: A | Discharge status: Home / Self Care | Payer: Medicaid Other | Source: Ambulatory Visit | Attending: Family Medicine | Admitting: Family Medicine

## 2021-06-27 ENCOUNTER — Ambulatory Visit
Admission: RE | Admit: 2021-06-27 | Discharge: 2021-06-27 | Disposition: A | Discharge status: Home / Self Care | Payer: Medicare Other | Source: Ambulatory Visit | Attending: Family Medicine | Admitting: Family Medicine

## 2021-06-27 DIAGNOSIS — Z09 Encounter for follow-up examination after completed treatment for conditions other than malignant neoplasm: Secondary | ICD-10-CM

## 2021-06-27 DIAGNOSIS — N63 Unspecified lump in unspecified breast: Secondary | ICD-10-CM

## 2021-06-27 DIAGNOSIS — R922 Inconclusive mammogram: Secondary | ICD-10-CM | POA: Diagnosis not present

## 2021-06-27 DIAGNOSIS — Z87898 Personal history of other specified conditions: Secondary | ICD-10-CM

## 2021-06-27 DIAGNOSIS — N6315 Unspecified lump in the right breast, overlapping quadrants: Secondary | ICD-10-CM | POA: Diagnosis not present

## 2021-06-27 DIAGNOSIS — N6321 Unspecified lump in the left breast, upper outer quadrant: Secondary | ICD-10-CM | POA: Diagnosis not present

## 2021-06-30 DIAGNOSIS — H04123 Dry eye syndrome of bilateral lacrimal glands: Secondary | ICD-10-CM | POA: Diagnosis not present

## 2021-06-30 DIAGNOSIS — H2513 Age-related nuclear cataract, bilateral: Secondary | ICD-10-CM | POA: Diagnosis not present

## 2021-06-30 DIAGNOSIS — H0102B Squamous blepharitis left eye, upper and lower eyelids: Secondary | ICD-10-CM | POA: Diagnosis not present

## 2021-06-30 DIAGNOSIS — H0102A Squamous blepharitis right eye, upper and lower eyelids: Secondary | ICD-10-CM | POA: Diagnosis not present

## 2021-06-30 DIAGNOSIS — H401134 Primary open-angle glaucoma, bilateral, indeterminate stage: Secondary | ICD-10-CM | POA: Diagnosis not present

## 2021-07-01 DIAGNOSIS — G808 Other cerebral palsy: Secondary | ICD-10-CM | POA: Diagnosis not present

## 2021-07-01 DIAGNOSIS — G8194 Hemiplegia, unspecified affecting left nondominant side: Secondary | ICD-10-CM | POA: Diagnosis not present

## 2021-08-24 ENCOUNTER — Encounter: Payer: Self-pay | Admitting: Adult Health

## 2021-08-24 ENCOUNTER — Ambulatory Visit (INDEPENDENT_AMBULATORY_CARE_PROVIDER_SITE_OTHER): Payer: Medicare Other | Admitting: Adult Health

## 2021-08-24 VITALS — BP 119/71 | HR 71

## 2021-08-24 DIAGNOSIS — G40219 Localization-related (focal) (partial) symptomatic epilepsy and epileptic syndromes with complex partial seizures, intractable, without status epilepticus: Secondary | ICD-10-CM | POA: Diagnosis not present

## 2021-08-24 DIAGNOSIS — Z79899 Other long term (current) drug therapy: Secondary | ICD-10-CM | POA: Diagnosis not present

## 2021-08-24 DIAGNOSIS — R799 Abnormal finding of blood chemistry, unspecified: Secondary | ICD-10-CM | POA: Diagnosis not present

## 2021-08-24 DIAGNOSIS — E559 Vitamin D deficiency, unspecified: Secondary | ICD-10-CM | POA: Diagnosis not present

## 2021-08-24 DIAGNOSIS — Z5181 Encounter for therapeutic drug level monitoring: Secondary | ICD-10-CM | POA: Diagnosis not present

## 2021-08-24 NOTE — Patient Instructions (Signed)
Your Plan: ? ?Continue current antiseizure medication ? ?Please have PCP send office recent lab work for further review ? ? ? ?Follow-up in 1 year or call earlier if needed ? ? ? ? ?Thank you for coming to see [place] at [facility]. I hope we have been able to provide you high quality care today. ? ?You may receive a patient satisfaction survey over the next few weeks. We would appreciate your feedback and comments so that we may continue to improve ourselves and the health of our patients. ? ?

## 2021-08-24 NOTE — Progress Notes (Signed)
? ?[facility] ? ?PATIENT: [name] ?DOB: 10-Mar-1956 ? ? ?REASON FOR VISIT: Follow-up for myoclonus jerks, cerebral palsy and seizure disorder ?HISTORY FROM: Patient and sister [name] ? ? ?Chief complaint: ?Chief Complaint  ?Patient presents with  ? Follow-up  ?  RM 2 with sister [name]  ?Pt is well and stable, no concerns   ?  ? ? ?HISTORY OF PRESENT ILLNESS: ? ?Ms. [name] is a 66 year old female who continues to be followed in this office with Dr. Pearlean Brownie for long-term for seizure disorder management and monitoring.  She has been seizure-free since 2009.  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 ?Update 08/24/2021 JM: Patient returns for yearly seizure follow-up after prior visit 08/19/2020.  She has been doing well over the past year and has had no reoccurring seizure activity.  Remains on phenytoin, lamotrigine and levetiracetam, denies side effects.  Reports recent lab work by PCP but unable to view via epic and sister unsure what lab work was completed. Sister does mention some issues with leaning more towards the left side which has causes some issues with appropriately operating a motorized wheelchair. She is concerned if this continues to worsen, she may have difficulties eating or even being able to sit in w/c.  She does have a herringbone rod in place.  No further concerns at this time. ? ? ? ? ? ?REVIEW OF SYSTEMS: Full 14 system review of systems performed and notable only for those listed, all others are neg:  ?Constitutional: neg  ?Cardiovascular: neg ?Ear/Nose/Throat: Drooling ?Skin: neg ?Eyes: neg ?Respiratory: neg ?Gastroitestinal: neg  ?Hematology/Lymphatic: neg  ?Endocrine: neg ?Musculoskeletal: Wheelchair-bound ?Allergy/Immunology: neg ?Neurological: History of seizure disorder, cerebral palsy ?Psychiatric: neg ?Sleep : neg ? ? ?ALLERGIES: ?Allergies  ?Allergen Reactions  ? Aspirin   ? Benadryl [Diphenhydramine Hcl (Sleep)]   ? Codeine   ?  Phenobarbital   ? ? ?HOME MEDICATIONS: ?Outpatient Medications Prior to Visit  ?Medication Sig Dispense Refill  ? amLODipine (NORVASC) 5 MG tablet Take 5 mg by mouth daily.    ? betamethasone, augmented, (DIPROLENE) 0.05 % lotion Apply topically 2 (two) times daily.    ? carvedilol (COREG) 6.25 MG tablet Take 6.25 mg by mouth 2 (two) times daily with a meal.    ? DILANTIN 100 MG ER capsule Take 1 capsule (100 mg total) by mouth 2 (two) times daily. 180 capsule 3  ? famotidine (PEPCID) 20 MG tablet Take 20 mg by mouth daily.     ? hydrochlorothiazide (HYDRODIURIL) 25 MG tablet Take 25 mg by mouth daily. 12.5 mg daily    ? hydrOXYzine (ATARAX/VISTARIL) 25 MG tablet     ? KEPPRA 750 MG tablet Take 2 tablets (1,500 mg total) by mouth 2 (two) times daily. 120 tablet 3  ? LAMICTAL 150 MG tablet TAKE 1 TABLET BY MOUTH TWICE DAILY. 180 tablet 1  ? mupirocin ointment (BACTROBAN) 2 % Place 1 application into the nose 2 (two) times daily.    ? nystatin cream (MYCOSTATIN) Apply topically 2 (two) times daily. 30 g 1  ? simvastatin (ZOCOR) 10 MG tablet Take 10 mg by mouth at bedtime.    ? ?No facility-administered medications prior to visit.  ? ? ?PAST MEDICAL HISTORY: ?Past Medical History:  ?Diagnosis Date  ? Epilepsy (HCC)   ? Hypertension   ? Seizures (HCC)   ? ? ?PAST SURGICAL HISTORY: ?Past Surgical History:  ?Procedure Laterality Date  ? ABDOMINAL HYSTERECTOMY    ?  arm surgery    ? BACK SURGERY    ? BREAST SURGERY    ? cyst removal  ? ? ?FAMILY HISTORY: ?Family History  ?Problem Relation Age of Onset  ? Transient ischemic attack Mother   ? ? ?SOCIAL HISTORY: ?Social History  ? ?Socioeconomic History  ? Marital status: Single  ?  Spouse name: Not on file  ? Number of children: Not on file  ? Years of education: Not on file  ? Highest education level: Not on file  ?Occupational History  ? Not on file  ?Tobacco Use  ? Smoking status: Never  ? Smokeless tobacco: Never  ?Substance and Sexual Activity  ? Alcohol use: No  ? Drug  use: No  ? Sexual activity: Not on file  ?Other Topics Concern  ? Not on file  ?Social History Narrative  ? Lives with sister, Barbara BeneJoyce  ? ?Social Determinants of Health  ? ?Financial Resource Strain: Not on file  ?Food Insecurity: Not on file  ?Transportation Needs: Not on file  ?Physical Activity: Not on file  ?Stress: Not on file  ?Social Connections: Not on file  ?Intimate Partner Violence: Not on file  ? ? ? ?PHYSICAL EXAM ?Today's Vitals  ? 08/24/21 1546  ?BP: 119/71  ?Pulse: 71  ? ? ?There is no height or weight on file to calculate BMI. ? ? ?General: well developed, well nourished, pleasant middle-aged African-American female, seated, in no evident distress ?Head: head normocephalic and atraumatic.   ?Neck: supple with no carotid or supraclavicular bruits ?Cardiovascular: regular rate and rhythm, no murmurs ?Musculoskeletal: CP ?Skin:  no rash/petichiae ?Vascular:  Normal pulses all extremities ?  ?Neurologic Exam ?Mental Status: Awake and fully alert.   Severely dysarthric.  Oriented to place and time. Recent and remote memory intact. Attention span, concentration and fund of knowledge appropriate. Mood and affect appropriate.  ?Cranial Nerves: Pupils equal, briskly reactive to light. Extraocular movements full without nystagmus. Visual fields full to confrontation. Hearing intact. Facial sensation intact.  Mild left lower facial weakness.  Tongue, and palate moves normally and symmetrically.  ?Motor: Normal strength right upper and lower extremity. 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 ?Sensory.: intact to touch , pinprick , position and vibratory sensation.  ?Coordination: Rapid alternating movements normal in all extremities on the right.  ?Gait and Station: Deferred as patient wheelchair-bound ?Reflexes: 1+ and symmetric. Toes downgoing.  ? ? ? ? ? ? ? ?ASSESSMENT AND PLAN ?66 y.o. year old female  has a past medical history of Epilepsy who  continues to be followed for ongoing monitoring and management.  No generalized seizure activity since 2009 but intermittent brief myoclonic jerks continue which have been stable.  hx of cerebral palsy and wheelchair-bound ?  ?Continue Dilantin 100 mg twice daily, Keppra 1500 mg twice daily, and Lamictal 150 mg twice daily -refills up-to-date.  Needs to remain on brand-name medications as previously failed generic medications ?Complete lamotrigine level, phenytoin level and vitamin D level ?Reports recent CMP and CBC completed by PCP WNL - requested lab work be faxed to office to review ?discussed possible device for w/c to help prevent further leaning towards left - she will discuss with her DME company and notify if further assistance is needed.  May consider PT evaluation for further input ? ? ? ? ?Follow-up in 1 year or call earlier if needed ? ? ? ?CC:  ?Mirna MiresHill, Gerald, MD   ? ?I spent 26 minutes  of face-to-face and non-face-to-face time with patient and sister.  This included previsit chart review, lab review, study review, order entry, electronic health record documentation, patient education and discussion regarding history of seizures and ongoing use of AEDs, obtaining lab work, cerebral palsy concerns and answered all other questions to patient and sister satisfaction ? ? ?Ihor Austin, AGNP-BC ? ?[facility] ?[address] ?[place], [place] [address] ? ?Phone [phone] Fax [phone] ?Note: This document was prepared with digital dictation and possible smart phrase technology. Any transcriptional errors that result from this process are unintentional. ? ?

## 2021-08-25 LAB — LAMOTRIGINE LEVEL: Lamotrigine Lvl: 11.7 ug/mL (ref 2.0–20.0)

## 2021-08-25 LAB — VITAMIN D 25 HYDROXY (VIT D DEFICIENCY, FRACTURES): Vit D, 25-Hydroxy: 38.5 ng/mL (ref 30.0–100.0)

## 2021-08-25 LAB — PHENYTOIN LEVEL, TOTAL: Phenytoin (Dilantin), Serum: 24.8 ug/mL (ref 10.0–20.0)

## 2021-08-29 DIAGNOSIS — G809 Cerebral palsy, unspecified: Secondary | ICD-10-CM | POA: Diagnosis not present

## 2021-08-29 DIAGNOSIS — G8194 Hemiplegia, unspecified affecting left nondominant side: Secondary | ICD-10-CM | POA: Diagnosis not present

## 2021-08-29 DIAGNOSIS — I10 Essential (primary) hypertension: Secondary | ICD-10-CM | POA: Diagnosis not present

## 2021-08-29 DIAGNOSIS — E785 Hyperlipidemia, unspecified: Secondary | ICD-10-CM | POA: Diagnosis not present

## 2021-08-29 DIAGNOSIS — K219 Gastro-esophageal reflux disease without esophagitis: Secondary | ICD-10-CM | POA: Diagnosis not present

## 2021-08-29 DIAGNOSIS — R569 Unspecified convulsions: Secondary | ICD-10-CM | POA: Diagnosis not present

## 2021-08-29 DIAGNOSIS — G808 Other cerebral palsy: Secondary | ICD-10-CM | POA: Diagnosis not present

## 2021-09-12 IMAGING — MG MM DIGITAL DIAGNOSTIC BILAT W/ TOMO W/ CAD
4 of 8 series · 4 of 24 positions shown · non-contrast
Comparison: Prior ultrasounds from 01/08/2019.
COMPARISON: Prior ultrasounds from 01/08/2019.

Addendum:
CLINICAL DATA: Patient's caretaker and family member discovered a
palpable lump in the left breast.She was seen at [facility] for further
assessment. That time was felt that mammography could not be
performed due to the patient's condition. Patient has advanced
cerebral palsy and is wheelchair bound. Bilateral breast ultrasound
was performed. On the left, 3 masses were detected. The largest was
at 12 o'clock, 3 cm the nipple, 2.8 cm, corresponding to the
palpable abnormality. Mass contained shadowing calcifications, was
oval and hypoechoic and circumscribed. A similar appearing mass was
noted at 1 o'clock, 4 cm the nipple, measuring 1.5 cm, with dense
shadowing calcifications. Also at 1 o'clock, 4 cm from the nipple,
more posteriorly, there is a lobulated mixed echogenicity mass
measuring 1 cm. On the right a 9 mm oval circumscribed hypoechoic
mass was noted at 3 o'clock, central depth. Recommendation was to
biopsy the 12 o'clock position 2.8 cm mass and the 1 o'clock
position, 9 mm mass, and a benign, short-term follow-up for the
other 2 masses.

Patient presents today possible biopsy, but initially for attempt at
diagnostic mammography.
EXAM:
DIGITAL DIAGNOSTIC BILATERAL MAMMOGRAM WITH CAD AND TOMO

[L CC synth-2D]
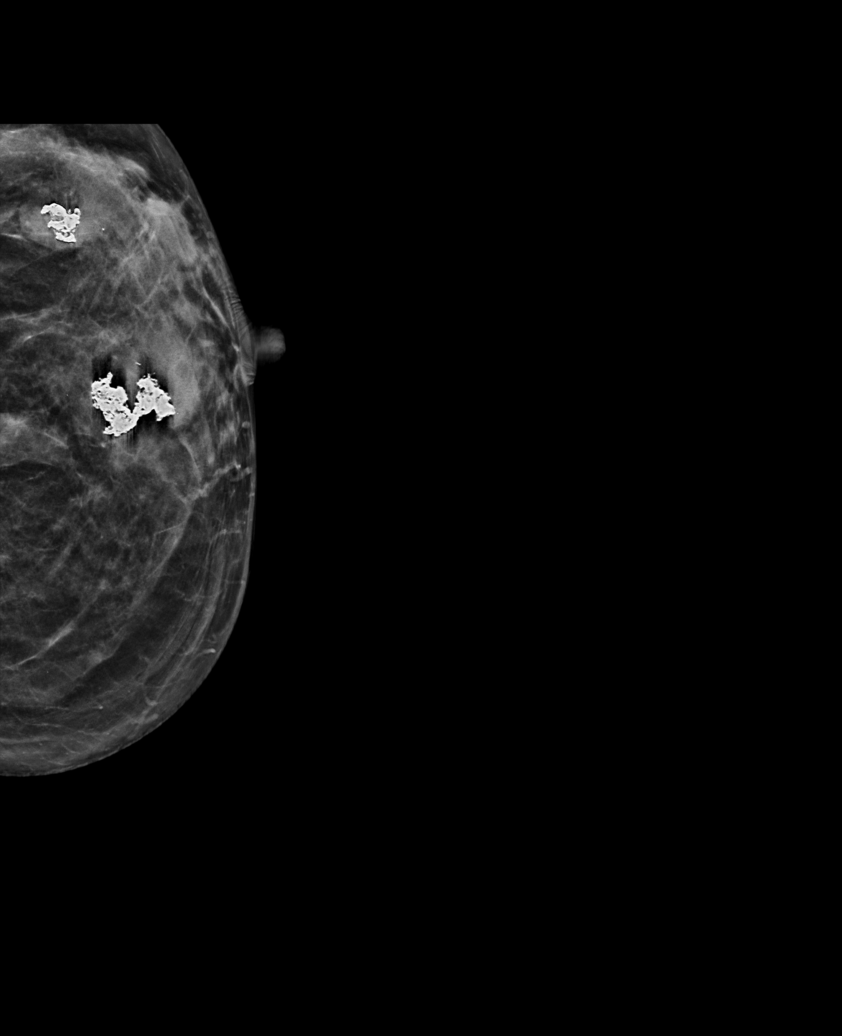

[L MLO synth-2D]
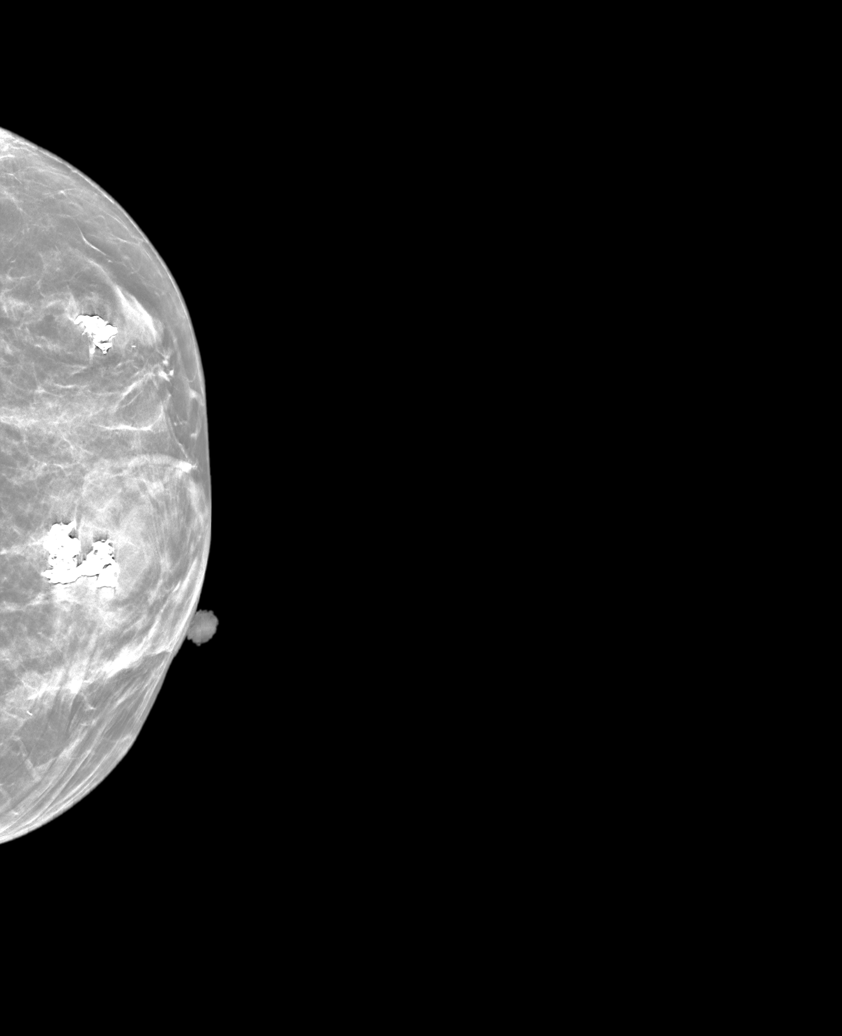

[R MLO synth-2D]
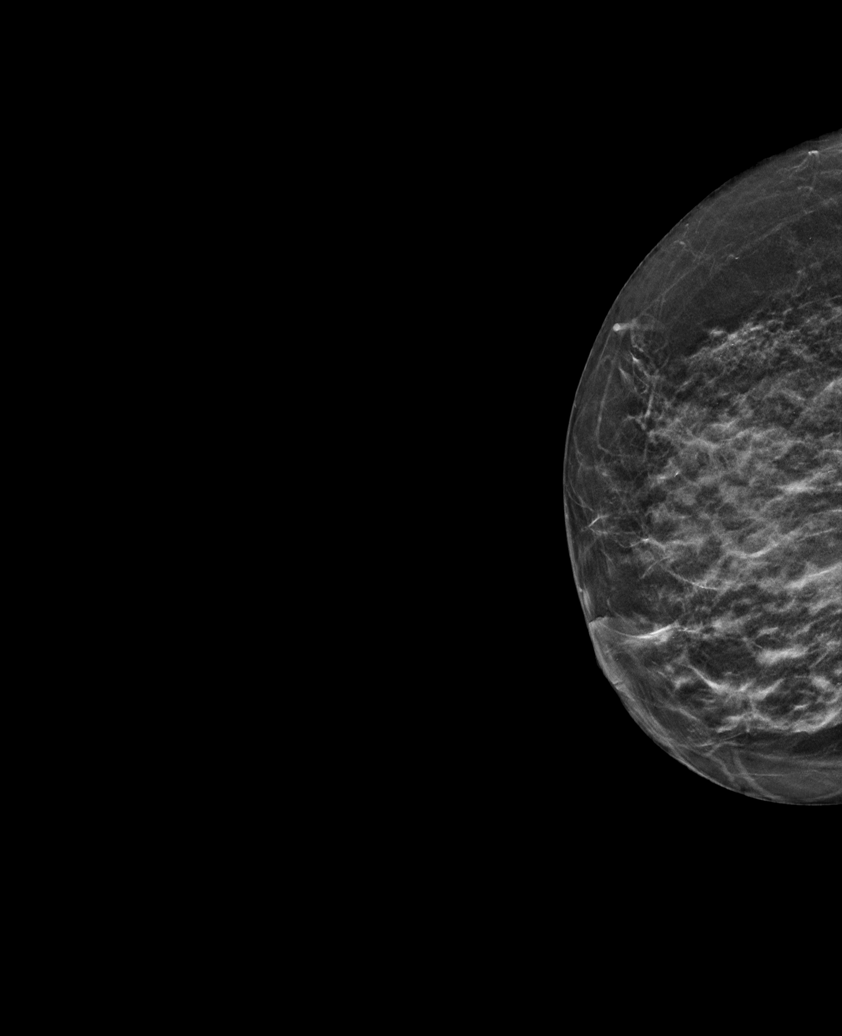

[R CC synth-2D]
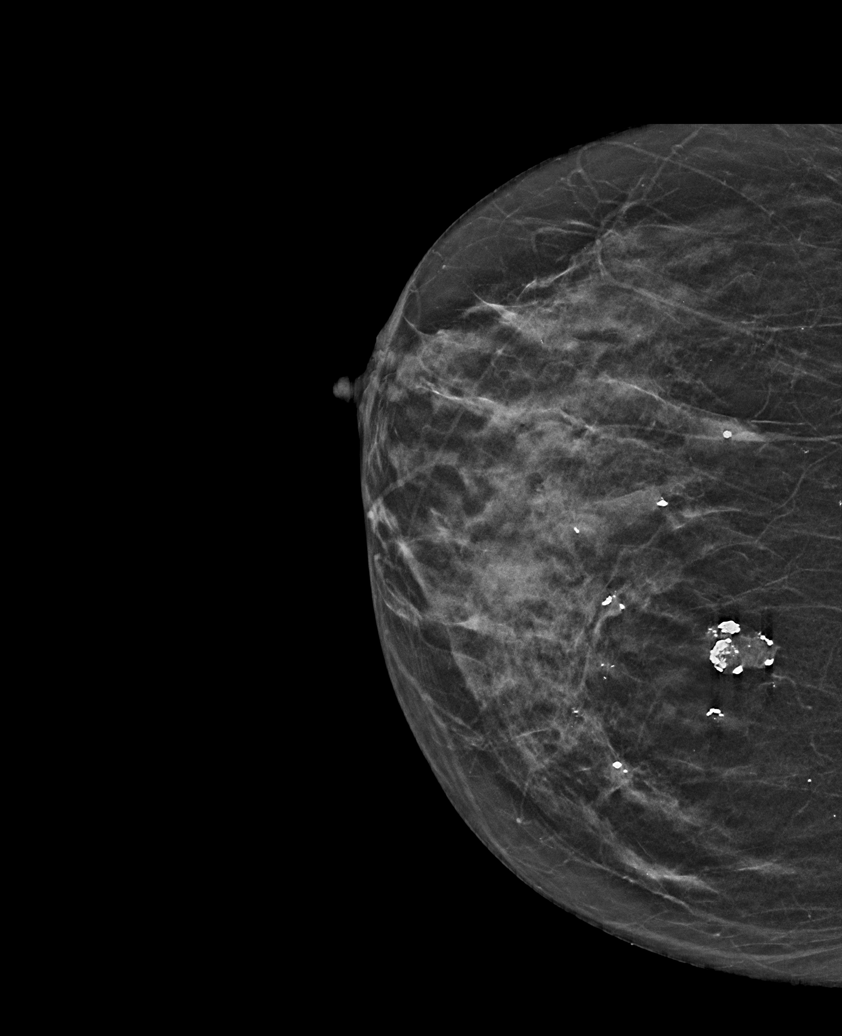

[4 of 24 positions shown; findings below may reference images not displayed]

ACR Breast Density Category c: The breast tissue is heterogeneously
dense, which may obscure small masses.
FINDINGS: On diagnostic mammography, there are multiple bilateral masses
containing dystrophic calcifications consistent with involuting
fibroadenomas. On the left, the largest mass lies in the central to
anterior depth, is partly circumscribed, measuring 3 cm in size,
corresponding to the 2.8 cm mass seen sonographically at 12 o'clock,
and corresponding to the palpable mass. In the upper outer quadrant
is another circumscribed mass containing dystrophic calcification
measuring 1.6 cm, corresponding to the second shadowing mass seen on
ultrasound at 1 o'clock. The third mass seen on the left
sonographically is not definitively seen on mammography, likely
posterior to the field of view obtainable with this patient. On the
right, there is a circumscribed mass with dystrophic calcifications
in the posterior, medial right breast with several smaller
dystrophic calcifications scattered elsewhere in the right breast.
The small oval, 9 mm mass seen on sonography at 3 o'clock is not
definitively seen mammographically.

There are no suspicious mammographic masses and no areas of
architectural distortion. There are no suspicious mammographic
calcifications.

Mammographic images were processed with CAD.
IMPRESSION: 1. The palpable left breast mass, corresponding to the 2.8 cm 12
o'clock position sonographic mass, is a benign fibroadenoma. No
additional evaluation is indicated.
2. The mass seen on the left sonographically at 1 o'clock, measuring
1.5 cm, corresponds to a second mass containing dystrophic
calcifications, also a benign fibroadenoma.
3. The third mass seen on the left sonographically, at 1 o'clock,
measuring 1 cm, is not defined mammographically. This is a probably
benign, mass most likely a fibroadenoma.
4. The fourth mass, seen on the right at 3 o'clock, measuring 9 mm,
is not defined mammographically. This is a probably benign mass,
most likely a fibroadenoma.

RECOMMENDATION:
1. Short-term follow-up with bilateral breast sonography, on the
left to evaluate the 1 o'clock position, 1 cm mass, and on the right
to reassess the 9 mm 3 o'clock position mass.

I have discussed the findings and recommendations with the patient.
If applicable, a reminder letter will be sent to the patient
regarding the next appointment.

BI-RADS CATEGORY  3: Probably benign.

ADDENDUM:
Bilateral breast ultrasound should be performed in 6 months.

*** End of Addendum ***
ACR Breast Density Category c: The breast tissue is heterogeneously
dense, which may obscure small masses.
FINDINGS: On diagnostic mammography, there are multiple bilateral masses
containing dystrophic calcifications consistent with involuting
fibroadenomas. On the left, the largest mass lies in the central to
anterior depth, is partly circumscribed, measuring 3 cm in size,
corresponding to the 2.8 cm mass seen sonographically at 12 o'clock,
and corresponding to the palpable mass. In the upper outer quadrant
is another circumscribed mass containing dystrophic calcification
measuring 1.6 cm, corresponding to the second shadowing mass seen on
ultrasound at 1 o'clock. The third mass seen on the left
sonographically is not definitively seen on mammography, likely
posterior to the field of view obtainable with this patient. On the
right, there is a circumscribed mass with dystrophic calcifications
in the posterior, medial right breast with several smaller
dystrophic calcifications scattered elsewhere in the right breast.
The small oval, 9 mm mass seen on sonography at 3 o'clock is not
definitively seen mammographically.

There are no suspicious mammographic masses and no areas of
architectural distortion. There are no suspicious mammographic
calcifications.

Mammographic images were processed with CAD.
IMPRESSION: 1. The palpable left breast mass, corresponding to the 2.8 cm 12
o'clock position sonographic mass, is a benign fibroadenoma. No
additional evaluation is indicated.
2. The mass seen on the left sonographically at 1 o'clock, measuring
1.5 cm, corresponds to a second mass containing dystrophic
calcifications, also a benign fibroadenoma.
3. The third mass seen on the left sonographically, at 1 o'clock,
measuring 1 cm, is not defined mammographically. This is a probably
benign, mass most likely a fibroadenoma.
4. The fourth mass, seen on the right at 3 o'clock, measuring 9 mm,
is not defined mammographically. This is a probably benign mass,
most likely a fibroadenoma.

RECOMMENDATION:
1. Short-term follow-up with bilateral breast sonography, on the
left to evaluate the 1 o'clock position, 1 cm mass, and on the right
to reassess the 9 mm 3 o'clock position mass.

I have discussed the findings and recommendations with the patient.
If applicable, a reminder letter will be sent to the patient
regarding the next appointment.

BI-RADS CATEGORY  3: Probably benign.

## 2021-10-01 DIAGNOSIS — G808 Other cerebral palsy: Secondary | ICD-10-CM | POA: Diagnosis not present

## 2021-10-01 DIAGNOSIS — G8194 Hemiplegia, unspecified affecting left nondominant side: Secondary | ICD-10-CM | POA: Diagnosis not present

## 2021-10-21 ENCOUNTER — Other Ambulatory Visit: Payer: Self-pay | Admitting: Adult Health

## 2021-10-28 DIAGNOSIS — I10 Essential (primary) hypertension: Secondary | ICD-10-CM | POA: Diagnosis not present

## 2021-10-28 DIAGNOSIS — E785 Hyperlipidemia, unspecified: Secondary | ICD-10-CM | POA: Diagnosis not present

## 2021-10-31 DIAGNOSIS — G808 Other cerebral palsy: Secondary | ICD-10-CM | POA: Diagnosis not present

## 2021-10-31 DIAGNOSIS — G8194 Hemiplegia, unspecified affecting left nondominant side: Secondary | ICD-10-CM | POA: Diagnosis not present

## 2021-12-01 DIAGNOSIS — G808 Other cerebral palsy: Secondary | ICD-10-CM | POA: Diagnosis not present

## 2021-12-01 DIAGNOSIS — G8194 Hemiplegia, unspecified affecting left nondominant side: Secondary | ICD-10-CM | POA: Diagnosis not present

## 2022-01-05 ENCOUNTER — Other Ambulatory Visit: Payer: Self-pay | Admitting: Adult Health

## 2022-01-19 ENCOUNTER — Other Ambulatory Visit: Payer: Self-pay | Admitting: Adult Health

## 2022-01-29 DIAGNOSIS — G8194 Hemiplegia, unspecified affecting left nondominant side: Secondary | ICD-10-CM | POA: Diagnosis not present

## 2022-01-29 DIAGNOSIS — G808 Other cerebral palsy: Secondary | ICD-10-CM | POA: Diagnosis not present

## 2022-03-03 DIAGNOSIS — G808 Other cerebral palsy: Secondary | ICD-10-CM | POA: Diagnosis not present

## 2022-03-03 DIAGNOSIS — G8194 Hemiplegia, unspecified affecting left nondominant side: Secondary | ICD-10-CM | POA: Diagnosis not present

## 2022-04-02 DIAGNOSIS — G808 Other cerebral palsy: Secondary | ICD-10-CM | POA: Diagnosis not present

## 2022-04-02 DIAGNOSIS — G8194 Hemiplegia, unspecified affecting left nondominant side: Secondary | ICD-10-CM | POA: Diagnosis not present

## 2022-05-08 ENCOUNTER — Other Ambulatory Visit: Payer: Self-pay | Admitting: Psychiatry

## 2022-05-18 ENCOUNTER — Telehealth: Payer: Self-pay | Admitting: Adult Health

## 2022-05-18 NOTE — Telephone Encounter (Signed)
Pt's sister is asking for a call to discuss the PA which is needed on pt's KEPPRA 750 MG tablet , Pt [name]: [employer] Member JH#[id number] Plan [phone] Rx WYO#[id number] Rx PCN#[id number] Rx [phone] Members Provider Service#[phone]

## 2022-05-18 NOTE — Telephone Encounter (Signed)
Please submit new PA  

## 2022-05-19 ENCOUNTER — Other Ambulatory Visit (HOSPITAL_COMMUNITY): Payer: Self-pay

## 2022-05-19 NOTE — Telephone Encounter (Signed)
Initiated a new Contractor states no PA needed.

## 2022-05-22 NOTE — Telephone Encounter (Signed)
Contacted [employer] back, spoke to [place]. Answered clinical questions, advised her Pt Remains on phenytoin, lamotrigine and levetiracetam.  -She has been seizure-free since 2009  Determination will return in 72hrs

## 2022-05-22 NOTE — Telephone Encounter (Addendum)
Noted   Please advise sister we Initiated a new PA-however [employer] states no PA needed.

## 2022-05-22 NOTE — Telephone Encounter (Signed)
Summer from [facility] is asking for a call back for needed clinical information.  Call back [phone] MOL#[id number]

## 2022-05-22 NOTE — Telephone Encounter (Signed)
Called and LVM for sister informing her of the PA. Left office number for her to call back if she has any other questions.

## 2022-05-22 NOTE — Telephone Encounter (Signed)
Sister called back. Stated she needs documentation sent to insurance on why pt needs to stay on Keppa, is not on there formula.

## 2022-05-29 NOTE — Telephone Encounter (Signed)
[place] again, spoke to [place]. She is faxing me reason for denial.

## 2022-05-29 NOTE — Telephone Encounter (Signed)
Denial received with appeal form, I have completed forms and faxed back to [facility]. Pt cannot be on generic keppra. Confirmation relieved, sister notified.

## 2022-05-29 NOTE — Telephone Encounter (Signed)
Pt's sister, Therisa Doyne said spoke with [facility] Friday, 05/26/22  asking for letter sent to [facility] stating medically necessary for pt to be on [place]. Also why formulary for Keppra can not be changed. Please return phone call to discuss.

## 2022-05-30 ENCOUNTER — Encounter: Payer: Self-pay | Admitting: Adult Health

## 2022-05-30 NOTE — Telephone Encounter (Signed)
Approval received via fax for Brand Keppra 05/01/2022-05/01/2023. Sister and pharmacy notified.

## 2022-05-30 NOTE — Telephone Encounter (Signed)
Have a couple of clinical questions regarding denial appeal can contact at [phone] Reference key: [id number]

## 2022-05-30 NOTE — Telephone Encounter (Signed)
Contacted insurance back, spoke to [place]. She asked me the same questions that was sent in appeal. Reason as to why patient cannot take generic. I advised her she was on generic back in 2008 and was having break through sz. I attached those notes to appeal and sent it back again this morning.  She stated there are 46 hrs left to receive decision, she is processing all information.

## 2022-06-05 ENCOUNTER — Other Ambulatory Visit: Payer: Self-pay | Admitting: Psychiatry

## 2022-06-05 NOTE — Telephone Encounter (Signed)
Patient has follow up scheduled on 08/29/22 with Dr.Sethi.  Rx sent.

## 2022-06-09 ENCOUNTER — Other Ambulatory Visit: Payer: Self-pay | Admitting: Family Medicine

## 2022-06-09 DIAGNOSIS — Z1231 Encounter for screening mammogram for malignant neoplasm of breast: Secondary | ICD-10-CM

## 2022-06-27 DIAGNOSIS — K219 Gastro-esophageal reflux disease without esophagitis: Secondary | ICD-10-CM | POA: Diagnosis not present

## 2022-06-27 DIAGNOSIS — I10 Essential (primary) hypertension: Secondary | ICD-10-CM | POA: Diagnosis not present

## 2022-06-27 DIAGNOSIS — E785 Hyperlipidemia, unspecified: Secondary | ICD-10-CM | POA: Diagnosis not present

## 2022-06-27 DIAGNOSIS — G8194 Hemiplegia, unspecified affecting left nondominant side: Secondary | ICD-10-CM | POA: Diagnosis not present

## 2022-06-27 DIAGNOSIS — G809 Cerebral palsy, unspecified: Secondary | ICD-10-CM | POA: Diagnosis not present

## 2022-06-27 DIAGNOSIS — H6123 Impacted cerumen, bilateral: Secondary | ICD-10-CM | POA: Diagnosis not present

## 2022-06-27 DIAGNOSIS — R569 Unspecified convulsions: Secondary | ICD-10-CM | POA: Diagnosis not present

## 2022-06-27 DIAGNOSIS — E7800 Pure hypercholesterolemia, unspecified: Secondary | ICD-10-CM | POA: Diagnosis not present

## 2022-06-27 DIAGNOSIS — G40219 Localization-related (focal) (partial) symptomatic epilepsy and epileptic syndromes with complex partial seizures, intractable, without status epilepticus: Secondary | ICD-10-CM | POA: Diagnosis not present

## 2022-06-27 DIAGNOSIS — E559 Vitamin D deficiency, unspecified: Secondary | ICD-10-CM | POA: Diagnosis not present

## 2022-06-27 DIAGNOSIS — G804 Ataxic cerebral palsy: Secondary | ICD-10-CM | POA: Diagnosis not present

## 2022-07-21 DIAGNOSIS — G809 Cerebral palsy, unspecified: Secondary | ICD-10-CM | POA: Diagnosis not present

## 2022-07-21 DIAGNOSIS — L8990 Pressure ulcer of unspecified site, unspecified stage: Secondary | ICD-10-CM | POA: Diagnosis not present

## 2022-07-31 ENCOUNTER — Telehealth: Payer: Self-pay | Admitting: Adult Health

## 2022-07-31 NOTE — Telephone Encounter (Signed)
Attempted to submit PA via [facility] Key: [id number] Response "There is an existing case within the [facility] environment that has the same patient, prescriber, and drug. This case must be finalized before proceeding with similar requests."  Did someone attempt to submit this PA already? (I am unable to see pharmacy TE, if so)

## 2022-07-31 NOTE — Telephone Encounter (Signed)
Called and spoke to the pt's sister and she stated that the pt's LAMICTAL 150 MG tablet is needing to have an appeal done in order for the insurance to cover it. Please advise.

## 2022-08-01 ENCOUNTER — Other Ambulatory Visit (HOSPITAL_COMMUNITY): Payer: Self-pay

## 2022-08-01 ENCOUNTER — Ambulatory Visit
Admission: RE | Admit: 2022-08-01 | Discharge: 2022-08-01 | Disposition: A | Discharge status: Home / Self Care | Payer: Medicare HMO | Source: Ambulatory Visit | Attending: Family Medicine | Admitting: Family Medicine

## 2022-08-01 DIAGNOSIS — Z1231 Encounter for screening mammogram for malignant neoplasm of breast: Secondary | ICD-10-CM

## 2022-08-01 NOTE — Telephone Encounter (Addendum)
Called insurance and additional information was needed-PA was still under review-They will fax me the forms needed for clinical information-Forms have been filled out and sent in along with clinical notes to [phone].

## 2022-08-03 NOTE — Telephone Encounter (Signed)
Daphne from [facility] [phone]) is asking for a call re: pt's  , DILANTIN 100 MG ER capsule she stated the supporting question line for pt's Lamictal needs to also be called [phone]) because there is information not completed yet by provider.

## 2022-08-03 NOTE — Telephone Encounter (Signed)
Please advise sister of the note below from 19. PA is still under review

## 2022-08-03 NOTE — Telephone Encounter (Addendum)
Contacted human back, spoke to [place], she just stated PA was needed for Dilantin. I have submitted PA via CMM   Key: [id number] Your information has been sent to [facility] Approval received via fax until 05/01/2023. Pharmacy notified.

## 2022-08-03 NOTE — Telephone Encounter (Signed)
Pt sister called. Asking for update on PA. She is requesting a call back from nurse.

## 2022-08-15 NOTE — Telephone Encounter (Signed)
Pt's sister called wanting to know what is the update on the PA for the LAMICTAL 150 MG tablet. She states that the pt is about to run out and has not heard from the RN with any updates. Please advise.

## 2022-08-15 NOTE — Telephone Encounter (Signed)
Any update on Lamictal ?

## 2022-08-16 ENCOUNTER — Telehealth: Payer: Self-pay | Admitting: Adult Health

## 2022-08-16 ENCOUNTER — Other Ambulatory Visit (HOSPITAL_COMMUNITY): Payer: Self-pay

## 2022-08-16 NOTE — Telephone Encounter (Signed)
Contacted [employer] again, spoke to [employer], competed appeal process. Decision will be made and faxed within 72 hrs.

## 2022-08-16 NOTE — Telephone Encounter (Signed)
I tried to submit a PA on CMM-looks like one was done previously (unsure by whom or where) and was denied-it would not let me do the PA on CMM so I outreached [employer] and verified this was denied-asked them to fax me the denial letter-is scanned into chart now under the media tab-Per [employer] this med is not on formulary and advise to change med to one that is on the formulary (Lamotrigine) or the PT or provider must submit an appeal in order to try to get this medication approved. We do not have a pharmacist to handle appeals at this time-the appeal info is in the denial letter.

## 2022-08-16 NOTE — Telephone Encounter (Signed)
Please see other phone note discussing this concern

## 2022-08-16 NOTE — Telephone Encounter (Addendum)
Contacted pt sister back, informed her I just received the decision this morning and the medication got denied. I will attempt appeal now. She verbally understood.   Contacted [employer], got transferredx3 to attempt appeal. After being on the phone for 53 minutes, I spoke to [place] who stated I cannot appeal the decision, although I have been appealing both lamictal and keppra. Patient needs to give person to them to speak with [place]. Per sister, she will run out of medication today. I called sister back and advised her to call them ASAP to give them consent to allow our office to appeal decision in her behalf. She verbally understood and will call them now.

## 2022-08-16 NOTE — Telephone Encounter (Signed)
Pt's sister called stating that she spoke to [facility] and they informed her that they now do see documentation that they have spoken to [employer] and that [employer] can call back at [phone]. Please advise.

## 2022-08-16 NOTE — Telephone Encounter (Signed)
Pt's sister called stating that she spoke to [employer] and they informed her that they now do see documentation that they have spoken to [employer] and that [employer] can call back at [phone]. Please advise.      

## 2022-08-16 NOTE — Telephone Encounter (Signed)
Pt sister called. Requesting a call back from the nurse to discuss PA. Stated she have called for the nurse several times but haven't gotten a call back yet. Stated she talked to insurance today and have some information she would like to share.

## 2022-08-22 NOTE — Telephone Encounter (Signed)
Contacted pt sister to see if they received anything in the mail regarding appeal decision. she did confirm she picked up Lamictal and Keppra yesterday with no issues so appeal had to be approved. She appreciated me for working on getting both meds approved and following up.

## 2022-08-29 ENCOUNTER — Ambulatory Visit: Payer: Medicare Other | Admitting: Adult Health

## 2022-08-29 ENCOUNTER — Encounter: Payer: Self-pay | Admitting: Neurology

## 2022-08-29 ENCOUNTER — Ambulatory Visit (INDEPENDENT_AMBULATORY_CARE_PROVIDER_SITE_OTHER): Payer: Medicare HMO | Admitting: Neurology

## 2022-08-29 VITALS — BP 135/78 | HR 77 | Ht 60.0 in | Wt 166.0 lb

## 2022-08-29 DIAGNOSIS — G40309 Generalized idiopathic epilepsy and epileptic syndromes, not intractable, without status epilepticus: Secondary | ICD-10-CM

## 2022-08-29 DIAGNOSIS — G802 Spastic hemiplegic cerebral palsy: Secondary | ICD-10-CM

## 2022-08-29 MED ORDER — DILANTIN 100 MG PO CAPS: ORAL_CAPSULE | ORAL | 3 refills | Status: DC

## 2022-08-29 MED ORDER — LAMICTAL 150 MG PO TABS: 150.0000 mg | ORAL_TABLET | Freq: Two times a day (BID) | ORAL | 3 refills | Status: DC

## 2022-08-29 MED ORDER — KEPPRA 750 MG PO TABS: 1500.0000 mg | ORAL_TABLET | Freq: Two times a day (BID) | ORAL | 3 refills | Status: DC

## 2022-08-29 NOTE — Patient Instructions (Signed)
I had a long discussion with patient and her sister regarding her cerebral palsy and long epilepsy seems quite well-controlled on the current medication regimen.  Continue Dilantin 100 mg twice daily, Keppra 1500 mg twice daily, and Lamictal 150 mg twice daily -refills up-to-date.  Needs to remain on brand-name medications as previously failed generic medication.Mar[place] Kitchen  She was given refills of her seizure medications for 1 year.  Return for follow-up in the future in a year or call earlier if necessary.

## 2022-08-29 NOTE — Progress Notes (Signed)
[facility]  PATIENT: [name] DOB: 1956/02/27   REASON FOR VISIT: Follow-up for myoclonus jerks, cerebral palsy and seizure disorder HISTORY FROM: Patient and sister [name]   Chief complaint: Chief Complaint  Patient presents with   Follow-up    Patient in room #3 with her sister. Patient here today f/u on epilepsy.      HISTORY OF PRESENT ILLNESS:  [name] is a 67 year old female who continues to be followed in this office with Dr. Pearlean Brownie for long-term for seizure disorder management and monitoring.  She has been seizure-free since 2009.  Occasional myoclonic jerks which are chronic and have not progressed  Update 08/29/2022 : She returns for follow-up after last visit a year ago.  She is accompanied by her sister.  Patient continues to do well and has not had any generalized tonic-clonic seizures 2009.  She continues to have intermittent myoclonic jerks which occur 4-5 times a month.,  May last variably from minutes to hours and rarely hold day.  These are nondisabling.  The sister feels that he is related to patient being constipated.  The current medication regimen of Dilantin, Keppra and Lamictal which seems to be working well.  She denies any side effects.  He is wheelchair-bound and is nonambulatory.  Her sister feels she is a lot more scoliosis and cannot sit straight and keeps on leaning to the left.  She had lab work done in primary care physician's office 2 months ago but I did not close results reviewed today.  Update 08/24/2021 JM: Patient returns for yearly seizure follow-up after prior visit 08/19/2020.  She has been doing well over the past year and has had no reoccurring seizure activity.  Remains on phenytoin, lamotrigine and levetiracetam, denies side effects.  Reports recent lab work by PCP but unable to view via epic and sister unsure what lab work was completed. Sister does mention some issues with leaning more towards the left side which has  causes some issues with appropriately operating a motorized wheelchair. She is concerned if this continues to worsen, she may have difficulties eating or even being able to sit in w/c.  She does have a herringbone rod in place.  No further concerns at this time.      REVIEW OF SYSTEMS: Full 14 system review of systems performed and notable only for those listed, all others are neg:  Constitutional: neg  Cardiovascular: neg Ear/Nose/Throat: Drooling Skin: neg Eyes: neg Respiratory: neg Gastroitestinal: neg  Hematology/Lymphatic: neg  Endocrine: neg Musculoskeletal: Wheelchair-bound Allergy/Immunology: neg Neurological: History of seizure disorder, cerebral palsy Psychiatric: neg Sleep : neg   ALLERGIES: Allergies  Allergen Reactions   Aspirin    Benadryl [Diphenhydramine Hcl (Sleep)]    Codeine    Phenobarbital     HOME MEDICATIONS: Outpatient Medications Prior to Visit  Medication Sig Dispense Refill   amLODipine (NORVASC) 5 MG tablet Take 5 mg by mouth daily.     betamethasone, augmented, (DIPROLENE) 0.05 % lotion Apply topically 2 (two) times daily.     carvedilol (COREG) 6.25 MG tablet Take 6.25 mg by mouth 2 (two) times daily with a meal.     DILANTIN 100 MG ER capsule TAKE (1) CAPSULE BY MOUTH TWICE DAILY. 180 capsule 3   famotidine (PEPCID) 20 MG tablet Take 20 mg by mouth daily.      hydrochlorothiazide (HYDRODIURIL) 25 MG tablet Take 25 mg by mouth daily. 12.5 mg daily     hydrOXYzine (ATARAX/VISTARIL) 25 MG tablet  KEPPRA 750 MG tablet TAKE 2 TABLETS BY MOUTH TWICE DAILY. 120 tablet 2   LAMICTAL 150 MG tablet TAKE 1 TABLET BY MOUTH TWICE DAILY. 180 tablet 3   mupirocin ointment (BACTROBAN) 2 % Place 1 application into the nose 2 (two) times daily.     nystatin cream (MYCOSTATIN) Apply topically 2 (two) times daily. 30 g 1   simvastatin (ZOCOR) 10 MG tablet Take 10 mg by mouth at bedtime.     No facility-administered medications prior to visit.    PAST  MEDICAL HISTORY: Past Medical History:  Diagnosis Date   Epilepsy (HCC)    Hypertension    Seizures (HCC)     PAST SURGICAL HISTORY: Past Surgical History:  Procedure Laterality Date   ABDOMINAL HYSTERECTOMY     arm surgery     BACK SURGERY     BREAST SURGERY     cyst removal    FAMILY HISTORY: Family History  Problem Relation Age of Onset   Transient ischemic attack Mother     SOCIAL HISTORY: Social History   Socioeconomic History   Marital status: Single    Spouse name: Not on file   Number of children: Not on file   Years of education: Not on file   Highest education level: Not on file  Occupational History   Not on file  Tobacco Use   Smoking status: Never   Smokeless tobacco: Never  Substance and Sexual Activity   Alcohol use: No   Drug use: No   Sexual activity: Not on file  Other Topics Concern   Not on file  Social History Narrative   Lives with sister, [name]   Social Determinants of Health   Financial Resource Strain: Not on file  Food Insecurity: Not on file  Transportation Needs: Not on file  Physical Activity: Not on file  Stress: Not on file  Social Connections: Not on file  Intimate Partner Violence: Not on file     PHYSICAL EXAM Today's Vitals   08/29/22 1321  BP: 135/78  Pulse: 77  Weight: 166 lb (75.3 kg)  Height: 5' (1.524 m)    Body mass index is 32.42 kg/m.   General: well developed, well nourished, pleasant middle-aged African-American female, seated, in no evident distress Head: head normocephalic and atraumatic.   Neck: supple with no carotid or supraclavicular bruits Cardiovascular: regular rate and rhythm, no murmurs Musculoskeletal: CP Skin:  no rash/petichiae Vascular:  Normal pulses all extremities   Neurologic Exam Mental Status: Awake and fully alert.   Severely dysarthric.  Oriented to place and time. Recent and remote memory intact. Attention span, concentration and fund of knowledge appropriate. Mood and  affect appropriate.  Cranial Nerves: Pupils equal, briskly reactive to light. Extraocular movements full without nystagmus. Visual fields full to confrontation. Hearing intact. Facial sensation intact.  Mild left lower facial weakness.  Tongue, and palate moves normally and symmetrically.  Motor: Normal strength right upper and lower extremity except weakness of right grip and intrinsic hand muscles.. 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 Sensory.: intact to touch , pinprick , position and vibratory sensation.  Coordination: Rapid alternating movements normal in all extremities on the right.  Gait and Station: Deferred as patient wheelchair-bound Reflexes: 1+ and symmetric. Toes downgoing.         ASSESSMENT AND PLAN 67 y.o. year old female  has a past medical history of Epilepsy who continues to be followed for ongoing monitoring and  management.  No generalized seizure activity since 2009 but intermittent brief myoclonic jerks continue which have been stable.  hx of cerebral palsy and wheelchair-bound   I had a long discussion with patient and her sister regarding her cerebral palsy and long epilepsy seems quite well-controlled on the current medication regimen.  Continue Dilantin 100 mg twice daily, Keppra 1500 mg twice daily, and Lamictal 150 mg twice daily -refills up-to-date.  Needs to remain on brand-name medications as previously failed generic medication.Marland Kitchen  She was given refills of her seizure medications for 1 year.  Return for follow-up in the future in a year  and request video visit or call earlier if necessary. I spen35 minutes of face-to-face and non-face-to-face time with patient and sister.  This included previsit chart review, lab review, study review, order entry, electronic health record documentation, patient education and discussion regarding history of seizures and ongoing use of AEDs, obtaining lab work,  cerebral palsy concerns and answered all other questions to patient and sister satisfaction   Delia Heady, MD  [facility] [address] [place], [place] [address]  Phone [phone] Fax [phone] Note: This document was prepared with digital dictation and possible smart phrase technology. Any transcriptional errors that result from this process are unintentional.

## 2022-08-30 ENCOUNTER — Encounter: Payer: Self-pay | Admitting: Family Medicine

## 2022-10-11 ENCOUNTER — Encounter: Payer: Self-pay | Admitting: Family Medicine

## 2022-10-16 ENCOUNTER — Encounter: Payer: Self-pay | Admitting: Family Medicine

## 2022-10-16 ENCOUNTER — Other Ambulatory Visit: Payer: Self-pay | Admitting: Family Medicine

## 2022-10-16 DIAGNOSIS — G804 Ataxic cerebral palsy: Secondary | ICD-10-CM

## 2022-12-01 ENCOUNTER — Other Ambulatory Visit: Payer: Self-pay | Admitting: Family Medicine

## 2022-12-01 ENCOUNTER — Ambulatory Visit
Admission: RE | Admit: 2022-12-01 | Discharge: 2022-12-01 | Disposition: A | Discharge status: Home / Self Care | Payer: Medicare HMO | Source: Ambulatory Visit | Attending: Family Medicine | Admitting: Family Medicine

## 2022-12-01 DIAGNOSIS — G804 Ataxic cerebral palsy: Secondary | ICD-10-CM

## 2022-12-01 DIAGNOSIS — Z1239 Encounter for other screening for malignant neoplasm of breast: Secondary | ICD-10-CM | POA: Diagnosis not present

## 2023-01-23 DIAGNOSIS — E559 Vitamin D deficiency, unspecified: Secondary | ICD-10-CM | POA: Diagnosis not present

## 2023-01-23 DIAGNOSIS — G804 Ataxic cerebral palsy: Secondary | ICD-10-CM | POA: Diagnosis not present

## 2023-01-23 DIAGNOSIS — G40219 Localization-related (focal) (partial) symptomatic epilepsy and epileptic syndromes with complex partial seizures, intractable, without status epilepticus: Secondary | ICD-10-CM | POA: Diagnosis not present

## 2023-01-23 DIAGNOSIS — E7800 Pure hypercholesterolemia, unspecified: Secondary | ICD-10-CM | POA: Diagnosis not present

## 2023-01-23 DIAGNOSIS — H6122 Impacted cerumen, left ear: Secondary | ICD-10-CM | POA: Diagnosis not present

## 2023-01-23 DIAGNOSIS — E785 Hyperlipidemia, unspecified: Secondary | ICD-10-CM | POA: Diagnosis not present

## 2023-01-23 DIAGNOSIS — H6123 Impacted cerumen, bilateral: Secondary | ICD-10-CM | POA: Diagnosis not present

## 2023-01-23 DIAGNOSIS — G8194 Hemiplegia, unspecified affecting left nondominant side: Secondary | ICD-10-CM | POA: Diagnosis not present

## 2023-01-23 DIAGNOSIS — Z23 Encounter for immunization: Secondary | ICD-10-CM | POA: Diagnosis not present

## 2023-01-23 DIAGNOSIS — I10 Essential (primary) hypertension: Secondary | ICD-10-CM | POA: Diagnosis not present

## 2023-01-23 DIAGNOSIS — K219 Gastro-esophageal reflux disease without esophagitis: Secondary | ICD-10-CM | POA: Diagnosis not present

## 2023-01-24 DIAGNOSIS — H401134 Primary open-angle glaucoma, bilateral, indeterminate stage: Secondary | ICD-10-CM | POA: Diagnosis not present

## 2023-01-24 DIAGNOSIS — H04123 Dry eye syndrome of bilateral lacrimal glands: Secondary | ICD-10-CM | POA: Diagnosis not present

## 2023-01-24 DIAGNOSIS — H2513 Age-related nuclear cataract, bilateral: Secondary | ICD-10-CM | POA: Diagnosis not present

## 2023-01-24 DIAGNOSIS — H0102B Squamous blepharitis left eye, upper and lower eyelids: Secondary | ICD-10-CM | POA: Diagnosis not present

## 2023-01-24 DIAGNOSIS — H0102A Squamous blepharitis right eye, upper and lower eyelids: Secondary | ICD-10-CM | POA: Diagnosis not present

## 2023-03-21 DIAGNOSIS — G809 Cerebral palsy, unspecified: Secondary | ICD-10-CM | POA: Diagnosis not present

## 2023-03-21 DIAGNOSIS — L8990 Pressure ulcer of unspecified site, unspecified stage: Secondary | ICD-10-CM | POA: Diagnosis not present

## 2023-04-10 DIAGNOSIS — G809 Cerebral palsy, unspecified: Secondary | ICD-10-CM | POA: Diagnosis not present

## 2023-04-10 DIAGNOSIS — I10 Essential (primary) hypertension: Secondary | ICD-10-CM | POA: Diagnosis not present

## 2023-04-10 DIAGNOSIS — J219 Acute bronchiolitis, unspecified: Secondary | ICD-10-CM | POA: Diagnosis not present

## 2023-05-25 ENCOUNTER — Telehealth: Payer: Self-pay | Admitting: Neurology

## 2023-05-25 NOTE — Telephone Encounter (Signed)
Pt's sister states Francine Graven is needing a exception from coverage letter for LAMICTAL 150 MG tablet like the one sent for Keppra on 05/30/22. States it is for coverage determination. Can be faxed to [phone] Can call [phone] with any questions

## 2023-05-28 ENCOUNTER — Telehealth: Payer: Self-pay | Admitting: Pharmacy Technician

## 2023-05-28 ENCOUNTER — Telehealth: Payer: Self-pay | Admitting: Neurology

## 2023-05-28 ENCOUNTER — Other Ambulatory Visit (HOSPITAL_COMMUNITY): Payer: Self-pay

## 2023-05-28 NOTE — Telephone Encounter (Signed)
See other phone messages on PA's completed for keppra and dilantin

## 2023-05-28 NOTE — Telephone Encounter (Signed)
Pharmacy Patient Advocate Encounter   Received notification from CoverMyMeds that prior authorization for Dilantin 100MG  capsules is required/requested.   Insurance verification completed.   The patient is insured through [place] .   Per test claim: The current 90 day co-pay is, $0.00.  No PA needed at this time. This test claim was processed through [facility]- copay amounts may vary at other pharmacies due to pharmacy/plan contracts, or as the patient moves through the different stages of their insurance plan.

## 2023-05-28 NOTE — Telephone Encounter (Signed)
Called pt and informed her of the PA approval but the pt would like to know if a PA request was also sent in for her Dilantin and Keppra. Please advise.

## 2023-05-28 NOTE — Telephone Encounter (Signed)
Completed a PA for the pt for brand name Lamictal on CMM/[employer] form KEY: [id number] Will await determination

## 2023-05-28 NOTE — Telephone Encounter (Signed)
PA completed on CMM/[employer] for the pt for brand name Dilantin. [id number] Approved immediately until 04/30/2024   PA submitted on CMM for Keppra as well brand name through [employer] KEY: [id number] Awaiting determination on Keppra

## 2023-05-28 NOTE — Telephone Encounter (Signed)
PA approved for the patient for 30 day supply instead of 90 day supply. Approved through [employer] until 04/30/2024

## 2023-05-29 NOTE — Telephone Encounter (Signed)
PA for Keppra brand name was approved through [facility] until 04/30/2024

## 2023-05-31 ENCOUNTER — Emergency Department (HOSPITAL_COMMUNITY)
Admission: EM | Admit: 2023-05-31 | Discharge: 2023-05-31 | Disposition: A | Discharge status: Home / Self Care | Payer: Medicare HMO | Attending: Emergency Medicine | Admitting: Emergency Medicine

## 2023-05-31 ENCOUNTER — Other Ambulatory Visit: Payer: Self-pay

## 2023-05-31 ENCOUNTER — Encounter (HOSPITAL_COMMUNITY): Payer: Self-pay | Admitting: Emergency Medicine

## 2023-05-31 DIAGNOSIS — I10 Essential (primary) hypertension: Secondary | ICD-10-CM | POA: Insufficient documentation

## 2023-05-31 DIAGNOSIS — T7840XA Allergy, unspecified, initial encounter: Secondary | ICD-10-CM | POA: Insufficient documentation

## 2023-05-31 DIAGNOSIS — R609 Edema, unspecified: Secondary | ICD-10-CM | POA: Diagnosis not present

## 2023-05-31 DIAGNOSIS — Z79899 Other long term (current) drug therapy: Secondary | ICD-10-CM | POA: Diagnosis not present

## 2023-05-31 DIAGNOSIS — Z7401 Bed confinement status: Secondary | ICD-10-CM | POA: Diagnosis not present

## 2023-05-31 DIAGNOSIS — R531 Weakness: Secondary | ICD-10-CM | POA: Diagnosis not present

## 2023-05-31 LAB — CBC WITH DIFFERENTIAL/PLATELET
Abs Immature Granulocytes: 0.04 10*3/uL (ref 0.00–0.07)
Basophils Absolute: 0 10*3/uL (ref 0.0–0.1)
Basophils Relative: 0 %
Eosinophils Absolute: 0 10*3/uL (ref 0.0–0.5)
Eosinophils Relative: 0 %
HCT: 37.3 % (ref 36.0–46.0)
Hemoglobin: 12.6 g/dL (ref 12.0–15.0)
Immature Granulocytes: 0 %
Lymphocytes Relative: 18 %
Lymphs Abs: 2 10*3/uL (ref 0.7–4.0)
MCH: 31.6 pg (ref 26.0–34.0)
MCHC: 33.8 g/dL (ref 30.0–36.0)
MCV: 93.5 fL (ref 80.0–100.0)
Monocytes Absolute: 0.9 10*3/uL (ref 0.1–1.0)
Monocytes Relative: 9 %
Neutro Abs: 7.8 10*3/uL — ABNORMAL HIGH (ref 1.7–7.7)
Neutrophils Relative %: 73 %
Platelets: 217 10*3/uL (ref 150–400)
RBC: 3.99 MIL/uL (ref 3.87–5.11)
RDW: 12.2 % (ref 11.5–15.5)
WBC: 10.7 10*3/uL — ABNORMAL HIGH (ref 4.0–10.5)
nRBC: 0 % (ref 0.0–0.2)

## 2023-05-31 LAB — BASIC METABOLIC PANEL
Anion gap: 15 (ref 5–15)
BUN: 16 mg/dL (ref 8–23)
CO2: 26 mmol/L (ref 22–32)
Calcium: 9.1 mg/dL (ref 8.9–10.3)
Chloride: 99 mmol/L (ref 98–111)
Creatinine, Ser: 0.62 mg/dL (ref 0.44–1.00)
GFR, Estimated: >60 mL/min (ref >60)
Glucose, Bld: 119 mg/dL — ABNORMAL HIGH (ref 70–99)
Potassium: 3.2 mmol/L — ABNORMAL LOW (ref 3.5–5.1)
Sodium: 140 mmol/L (ref 135–145)

## 2023-05-31 MED ORDER — CEPHALEXIN 500 MG PO CAPS
500.0000 mg | ORAL_CAPSULE | Freq: Four times a day (QID) | ORAL | 0 refills | Status: DC
Start: 2023-05-31 — End: 2023-08-28

## 2023-05-31 MED ORDER — METHYLPREDNISOLONE SODIUM SUCC 125 MG IJ SOLR
125.0000 mg | Freq: Once | INTRAMUSCULAR | Status: DC
  Filled 2023-05-31: qty 2

## 2023-05-31 MED ORDER — FAMOTIDINE IN NACL 20-0.9 MG/50ML-% IV SOLN
20.0000 mg | Freq: Once | INTRAVENOUS | Status: AC
  Administered 2023-05-31: 20 mg via INTRAVENOUS
  Filled 2023-05-31: qty 50

## 2023-05-31 MED ORDER — LORATADINE 10 MG PO TABS: 10.0000 mg | ORAL_TABLET | Freq: Every day | ORAL | 0 refills | Status: DC

## 2023-05-31 MED ORDER — EPINEPHRINE 0.3 MG/0.3ML IJ SOAJ
0.3000 mg | Freq: Once | INTRAMUSCULAR | Status: AC
  Administered 2023-05-31: 0.3 mg via INTRAMUSCULAR
  Filled 2023-05-31: qty 0.3

## 2023-05-31 MED ORDER — PREDNISONE 10 MG PO TABS: 20.0000 mg | ORAL_TABLET | Freq: Every day | ORAL | 0 refills | Status: DC

## 2023-05-31 MED ORDER — HYDROXYZINE HCL 50 MG/ML IM SOLN
50.0000 mg | Freq: Once | INTRAMUSCULAR | Status: AC
  Administered 2023-05-31: 50 mg via INTRAMUSCULAR
  Filled 2023-05-31: qty 1

## 2023-05-31 MED ORDER — METHYLPREDNISOLONE SODIUM SUCC 125 MG IJ SOLR
125.0000 mg | Freq: Once | INTRAMUSCULAR | Status: AC
  Administered 2023-05-31: 125 mg via INTRAVENOUS

## 2023-05-31 MED ORDER — FAMOTIDINE 20 MG PO TABS: 20.0000 mg | ORAL_TABLET | Freq: Two times a day (BID) | ORAL | 0 refills | Status: AC

## 2023-05-31 NOTE — Discharge Instructions (Signed)
Do not take any more of the Macrobid.  Follow-up with your doctor next week.  Return to the hospital if getting worse

## 2023-05-31 NOTE — ED Provider Notes (Signed)
[place] EMERGENCY DEPARTMENT AT [facility] Provider Note   CSN: [id number] Arrival date & time: 05/31/23  1928     History  Chief Complaint  Patient presents with   Allergic Reaction    [name] is a 68 y.o. female.  Patient has a history of hypertension and seizures.  She started on Macrobid for urinary tract infection and has taken it 1 day and now has developed swelling to her lips face and tongue  The history is provided by a relative. No language interpreter was used.  Allergic Reaction Presenting symptoms: no difficulty breathing and no rash   Severity:  Moderate Prior allergic episodes:  No prior episodes Context: not animal exposure   Relieved by:  Nothing Worsened by:  Nothing Ineffective treatments:  None tried      Home Medications Prior to Admission medications   Medication Sig Start Date End Date Taking? Authorizing Provider  cephALEXin (KEFLEX) 500 MG capsule Take 1 capsule (500 mg total) by mouth 4 (four) times daily. 05/31/23  Yes Bethann Berkshire, MD  famotidine (PEPCID) 20 MG tablet Take 1 tablet (20 mg total) by mouth 2 (two) times daily. 05/31/23  Yes Bethann Berkshire, MD  predniSONE (DELTASONE) 10 MG tablet Take 2 tablets (20 mg total) by mouth daily. 05/31/23  Yes Bethann Berkshire, MD  amLODipine (NORVASC) 5 MG tablet Take 5 mg by mouth daily.    [provider]  betamethasone, augmented, (DIPROLENE) 0.05 % lotion Apply topically 2 (two) times daily.    [provider]  carvedilol (COREG) 6.25 MG tablet Take 6.25 mg by mouth 2 (two) times daily with a meal.    [provider]  DILANTIN 100 MG ER capsule TAKE (1) CAPSULE BY MOUTH TWICE DAILY. 08/29/22   Micki Riley, MD  hydrochlorothiazide (HYDRODIURIL) 25 MG tablet Take 25 mg by mouth daily. 12.5 mg daily    [provider]  hydrOXYzine (ATARAX/VISTARIL) 25 MG tablet  01/08/15   [provider]  KEPPRA 750 MG tablet Take 2 tablets (1,500 mg  total) by mouth 2 (two) times daily. 08/29/22   Micki Riley, MD  LAMICTAL 150 MG tablet Take 1 tablet (150 mg total) by mouth 2 (two) times daily. 08/29/22   Micki Riley, MD  loratadine (CLARITIN) 10 MG tablet Take 1 tablet (10 mg total) by mouth daily. One po daily x 5 days 05/31/23   Bethann Berkshire, MD  mupirocin ointment (BACTROBAN) 2 % Place 1 application into the nose 2 (two) times daily.    [provider]  nystatin cream (MYCOSTATIN) Apply topically 2 (two) times daily. 10/07/12   Constant, Peggy, MD  simvastatin (ZOCOR) 10 MG tablet Take 10 mg by mouth at bedtime.    [provider]      Allergies    Aspirin, Benadryl [diphenhydramine hcl (sleep)], Codeine, and Phenobarbital    Review of Systems   Review of Systems  Constitutional:  Negative for appetite change and fatigue.  HENT:  Negative for congestion, ear discharge and sinus pressure.        Facial swelling  Eyes:  Negative for discharge.  Respiratory:  Negative for cough.   Cardiovascular:  Negative for chest pain.  Gastrointestinal:  Negative for abdominal pain and diarrhea.  Genitourinary:  Negative for frequency and hematuria.  Musculoskeletal:  Negative for back pain.  Skin:  Negative for rash.  Neurological:  Negative for seizures and headaches.  Psychiatric/Behavioral:  Negative for hallucinations.  Physical Exam Updated Vital Signs BP (!) 123/58   Pulse 85   Temp 97.7 F (36.5 C) (Axillary)   Resp 19   SpO2 97%  Physical Exam Vitals and nursing note reviewed.  Constitutional:      Appearance: She is well-developed.  HENT:     Head: Normocephalic.     Comments: Swelling to both lips with minimal swelling to the tongue    Nose: Nose normal.  Eyes:     General: No scleral icterus.    Conjunctiva/sclera: Conjunctivae normal.  Neck:     Thyroid: No thyromegaly.  Cardiovascular:     Rate and Rhythm: Normal rate and regular rhythm.     Heart sounds: No murmur heard.    No  friction rub. No gallop.  Pulmonary:     Breath sounds: No stridor. No wheezing or rales.  Chest:     Chest wall: No tenderness.  Abdominal:     General: There is no distension.     Tenderness: There is no abdominal tenderness. There is no rebound.  Musculoskeletal:        General: Normal range of motion.     Cervical back: Neck supple.  Lymphadenopathy:     Cervical: No cervical adenopathy.  Skin:    Findings: No erythema or rash.  Neurological:     Mental Status: She is alert and oriented to person, place, and time.     Motor: No abnormal muscle tone.     Coordination: Coordination normal.  Psychiatric:        Behavior: Behavior normal.     ED Results / Procedures / Treatments   Labs (all labs ordered are listed, but only abnormal results are displayed) Labs Reviewed  BASIC METABOLIC PANEL - Abnormal; Notable for the following components:      Result Value   Potassium 3.2 (*)    Glucose, Bld 119 (*)    All other components within normal limits  CBC WITH DIFFERENTIAL/PLATELET - Abnormal; Notable for the following components:   WBC 10.7 (*)    Neutro Abs 7.8 (*)    All other components within normal limits    EKG None  Radiology No results found.  Procedures Procedures    Medications Ordered in ED Medications  famotidine (PEPCID) IVPB 20 mg premix (has no administration in time range)  hydrOXYzine (VISTARIL) injection 50 mg (50 mg Intramuscular Given 05/31/23 2030)  EPINEPHrine (EPI-PEN) injection 0.3 mg (0.3 mg Intramuscular Given 05/31/23 2122)  methylPREDNISolone sodium succinate (SOLU-MEDROL) 125 mg/2 mL injection 125 mg (125 mg Intravenous Given 05/31/23 2120)    ED Course/ Medical Decision Making/ A&P  CRITICAL CARE Performed by: Bethann Berkshire Total critical care time: 45 minutes Critical care time was exclusive of separately billable procedures and treating other patients. Critical care was necessary to treat or prevent imminent or life-threatening  deterioration. Critical care was time spent personally by me on the following activities: development of treatment plan with patient and/or surrogate as well as nursing, discussions with consultants, evaluation of patient's response to treatment, examination of patient, obtaining history from patient or surrogate, ordering and performing treatments and interventions, ordering and review of laboratory studies, ordering and review of radiographic studies, pulse oximetry and re-evaluation of patient's condition.                                Medical Decision Making Amount and/or Complexity of Data Reviewed Labs: ordered.  Risk OTC drugs. Prescription drug management.   Patient with allergic reaction and urinary tract infection.  She will stop the Macrobid and start Keflex.  She will also be sent home with prednisone, Claritin and Pepcid and will follow-up with her PCP        Final Clinical Impression(s) / ED Diagnoses Final diagnoses:  Allergic reaction, initial encounter    Rx / DC Orders ED Discharge Orders          Ordered    predniSONE (DELTASONE) 10 MG tablet  Daily        05/31/23 2244    famotidine (PEPCID) 20 MG tablet  2 times daily        05/31/23 2244    cephALEXin (KEFLEX) 500 MG capsule  4 times daily        05/31/23 2244    loratadine (CLARITIN) 10 MG tablet  Daily,   Status:  Discontinued        05/31/23 2244    loratadine (CLARITIN) 10 MG tablet  Daily        05/31/23 2245              Bethann Berkshire, MD 06/03/23 [phone]

## 2023-05-31 NOTE — ED Notes (Signed)
Patient did not want any further IM medication.  MD aware.  Verbal order received to give SoluMedrol IV.

## 2023-05-31 NOTE — ED Triage Notes (Signed)
Patient BIB EMS from home for evaluation of possible allergic reaction.  Caretaker reports that the patient was recently started on Macrobid.  Had two doses.  Tonight around 1600, patient started to complain of sore throat and mouth pain.  No reports of difficulty breathing, no lip swelling, difficulty swallowing, or rashes noted.

## 2023-05-31 NOTE — ED Notes (Signed)
Pt given water per RN permission

## 2023-06-04 DIAGNOSIS — N390 Urinary tract infection, site not specified: Secondary | ICD-10-CM | POA: Diagnosis not present

## 2023-06-11 ENCOUNTER — Telehealth: Payer: Self-pay | Admitting: Neurology

## 2023-06-11 MED ORDER — LAMICTAL 150 MG PO TABS: 150.0000 mg | ORAL_TABLET | Freq: Two times a day (BID) | ORAL | 2 refills | Status: DC

## 2023-06-11 NOTE — Telephone Encounter (Signed)
 Pt's sister, Joyce Blackstock insurance has denied to cover due to prescription was written for 90-days but they will approved for 30-days. Requesting neurologist rewrite the prescription for 30 days. Would like a call back.with an update.  Can contact them at Phone: [phone]

## 2023-06-11 NOTE — Telephone Encounter (Signed)
 Last seen on 08/29/22 Follow up scheduled on 08/28/23 Rx sent for 30 day supply with refills

## 2023-06-25 ENCOUNTER — Ambulatory Visit
Admission: RE | Admit: 2023-06-25 | Discharge: 2023-06-25 | Disposition: A | Discharge status: Home / Self Care | Payer: Medicare HMO | Source: Ambulatory Visit | Attending: Family Medicine | Admitting: Family Medicine

## 2023-06-25 DIAGNOSIS — E7800 Pure hypercholesterolemia, unspecified: Secondary | ICD-10-CM | POA: Diagnosis not present

## 2023-06-25 DIAGNOSIS — G40219 Localization-related (focal) (partial) symptomatic epilepsy and epileptic syndromes with complex partial seizures, intractable, without status epilepticus: Secondary | ICD-10-CM | POA: Diagnosis not present

## 2023-06-25 DIAGNOSIS — N6322 Unspecified lump in the left breast, upper inner quadrant: Secondary | ICD-10-CM | POA: Diagnosis not present

## 2023-06-25 DIAGNOSIS — H6122 Impacted cerumen, left ear: Secondary | ICD-10-CM | POA: Diagnosis not present

## 2023-06-25 DIAGNOSIS — G8194 Hemiplegia, unspecified affecting left nondominant side: Secondary | ICD-10-CM | POA: Diagnosis not present

## 2023-06-25 DIAGNOSIS — N6325 Unspecified lump in the left breast, overlapping quadrants: Secondary | ICD-10-CM | POA: Diagnosis not present

## 2023-06-25 DIAGNOSIS — I10 Essential (primary) hypertension: Secondary | ICD-10-CM | POA: Diagnosis not present

## 2023-06-25 DIAGNOSIS — K219 Gastro-esophageal reflux disease without esophagitis: Secondary | ICD-10-CM | POA: Diagnosis not present

## 2023-06-25 DIAGNOSIS — G804 Ataxic cerebral palsy: Secondary | ICD-10-CM

## 2023-06-25 DIAGNOSIS — M25562 Pain in left knee: Secondary | ICD-10-CM | POA: Diagnosis not present

## 2023-06-25 DIAGNOSIS — M25561 Pain in right knee: Secondary | ICD-10-CM | POA: Diagnosis not present

## 2023-06-25 DIAGNOSIS — N6323 Unspecified lump in the left breast, lower outer quadrant: Secondary | ICD-10-CM | POA: Diagnosis not present

## 2023-06-28 ENCOUNTER — Other Ambulatory Visit: Payer: Self-pay | Admitting: Family Medicine

## 2023-06-28 DIAGNOSIS — N6310 Unspecified lump in the right breast, unspecified quadrant: Secondary | ICD-10-CM

## 2023-07-05 DIAGNOSIS — G40219 Localization-related (focal) (partial) symptomatic epilepsy and epileptic syndromes with complex partial seizures, intractable, without status epilepticus: Secondary | ICD-10-CM | POA: Diagnosis not present

## 2023-08-22 ENCOUNTER — Other Ambulatory Visit: Payer: Self-pay | Admitting: Neurology

## 2023-08-23 ENCOUNTER — Telehealth: Payer: Self-pay | Admitting: Neurology

## 2023-08-23 NOTE — Telephone Encounter (Signed)
 Pt's sister has requested an in office and not my chart, appointment changed

## 2023-08-28 ENCOUNTER — Emergency Department (HOSPITAL_COMMUNITY)

## 2023-08-28 ENCOUNTER — Other Ambulatory Visit: Payer: Self-pay

## 2023-08-28 ENCOUNTER — Observation Stay (HOSPITAL_COMMUNITY)
Admission: EM | Admit: 2023-08-28 | Discharge: 2023-08-29 | Disposition: A | Discharge status: Home / Self Care | Attending: Internal Medicine | Admitting: Internal Medicine

## 2023-08-28 ENCOUNTER — Encounter (HOSPITAL_COMMUNITY): Payer: Self-pay | Admitting: *Deleted

## 2023-08-28 ENCOUNTER — Ambulatory Visit: Payer: Medicare HMO | Admitting: Neurology

## 2023-08-28 DIAGNOSIS — Z79899 Other long term (current) drug therapy: Secondary | ICD-10-CM | POA: Diagnosis not present

## 2023-08-28 DIAGNOSIS — G40219 Localization-related (focal) (partial) symptomatic epilepsy and epileptic syndromes with complex partial seizures, intractable, without status epilepticus: Secondary | ICD-10-CM | POA: Diagnosis not present

## 2023-08-28 DIAGNOSIS — G809 Cerebral palsy, unspecified: Secondary | ICD-10-CM | POA: Diagnosis not present

## 2023-08-28 DIAGNOSIS — E870 Hyperosmolality and hypernatremia: Secondary | ICD-10-CM | POA: Diagnosis not present

## 2023-08-28 DIAGNOSIS — R443 Hallucinations, unspecified: Secondary | ICD-10-CM

## 2023-08-28 DIAGNOSIS — R442 Other hallucinations: Secondary | ICD-10-CM | POA: Diagnosis not present

## 2023-08-28 DIAGNOSIS — I10 Essential (primary) hypertension: Secondary | ICD-10-CM | POA: Insufficient documentation

## 2023-08-28 DIAGNOSIS — Z993 Dependence on wheelchair: Secondary | ICD-10-CM | POA: Insufficient documentation

## 2023-08-28 DIAGNOSIS — R109 Unspecified abdominal pain: Secondary | ICD-10-CM | POA: Diagnosis not present

## 2023-08-28 DIAGNOSIS — R627 Adult failure to thrive: Principal | ICD-10-CM | POA: Diagnosis present

## 2023-08-28 DIAGNOSIS — N2889 Other specified disorders of kidney and ureter: Principal | ICD-10-CM | POA: Insufficient documentation

## 2023-08-28 DIAGNOSIS — G40119 Localization-related (focal) (partial) symptomatic epilepsy and epileptic syndromes with simple partial seizures, intractable, without status epilepticus: Secondary | ICD-10-CM | POA: Diagnosis not present

## 2023-08-28 DIAGNOSIS — Z90710 Acquired absence of both cervix and uterus: Secondary | ICD-10-CM | POA: Diagnosis not present

## 2023-08-28 DIAGNOSIS — R4182 Altered mental status, unspecified: Secondary | ICD-10-CM | POA: Diagnosis not present

## 2023-08-28 DIAGNOSIS — R531 Weakness: Secondary | ICD-10-CM | POA: Diagnosis not present

## 2023-08-28 DIAGNOSIS — R932 Abnormal findings on diagnostic imaging of liver and biliary tract: Secondary | ICD-10-CM | POA: Diagnosis not present

## 2023-08-28 HISTORY — DX: Cerebral palsy, unspecified: G80.9

## 2023-08-28 LAB — TROPONIN I (HIGH SENSITIVITY)
Troponin I (High Sensitivity): <2 ng/L (ref <18)
Troponin I (High Sensitivity): <2 ng/L (ref <18)

## 2023-08-28 LAB — CBC WITH DIFFERENTIAL/PLATELET
Abs Immature Granulocytes: 0.02 10*3/uL (ref 0.00–0.07)
Basophils Absolute: 0 10*3/uL (ref 0.0–0.1)
Basophils Relative: 0 %
Eosinophils Absolute: 0 10*3/uL (ref 0.0–0.5)
Eosinophils Relative: 0 %
HCT: 36.6 % (ref 36.0–46.0)
Hemoglobin: 11.8 g/dL — ABNORMAL LOW (ref 12.0–15.0)
Immature Granulocytes: 0 %
Lymphocytes Relative: 32 %
Lymphs Abs: 1.5 10*3/uL (ref 0.7–4.0)
MCH: 32.7 pg (ref 26.0–34.0)
MCHC: 32.2 g/dL (ref 30.0–36.0)
MCV: 101.4 fL — ABNORMAL HIGH (ref 80.0–100.0)
Monocytes Absolute: 0.4 10*3/uL (ref 0.1–1.0)
Monocytes Relative: 9 %
Neutro Abs: 2.8 10*3/uL (ref 1.7–7.7)
Neutrophils Relative %: 59 %
Platelets: 256 10*3/uL (ref 150–400)
RBC: 3.61 MIL/uL — ABNORMAL LOW (ref 3.87–5.11)
RDW: 13.8 % (ref 11.5–15.5)
WBC: 4.8 10*3/uL (ref 4.0–10.5)
nRBC: 0 % (ref 0.0–0.2)

## 2023-08-28 LAB — URINALYSIS, ROUTINE W REFLEX MICROSCOPIC
Bacteria, UA: NONE SEEN
Bilirubin Urine: NEGATIVE
Glucose, UA: NEGATIVE mg/dL
Hgb urine dipstick: NEGATIVE
Ketones, ur: NEGATIVE mg/dL
Leukocytes,Ua: NEGATIVE
Nitrite: NEGATIVE
Protein, ur: 30 mg/dL — AB
Specific Gravity, Urine: 1.021 (ref 1.005–1.030)
pH: 7 (ref 5.0–8.0)

## 2023-08-28 LAB — COMPREHENSIVE METABOLIC PANEL WITH GFR
ALT: 7 U/L (ref 0–44)
AST: 14 U/L — ABNORMAL LOW (ref 15–41)
Albumin: 3.5 g/dL (ref 3.5–5.0)
Alkaline Phosphatase: 131 U/L — ABNORMAL HIGH (ref 38–126)
Anion gap: 10 (ref 5–15)
BUN: 13 mg/dL (ref 8–23)
CO2: 31 mmol/L (ref 22–32)
Calcium: 9.5 mg/dL (ref 8.9–10.3)
Chloride: 98 mmol/L (ref 98–111)
Creatinine, Ser: 0.48 mg/dL (ref 0.44–1.00)
GFR, Estimated: >60 mL/min (ref >60)
Glucose, Bld: 105 mg/dL — ABNORMAL HIGH (ref 70–99)
Potassium: 3.8 mmol/L (ref 3.5–5.1)
Sodium: 139 mmol/L (ref 135–145)
Total Bilirubin: 0.2 mg/dL (ref 0.0–1.2)
Total Protein: 6.6 g/dL (ref 6.5–8.1)

## 2023-08-28 LAB — LIPASE, BLOOD: Lipase: 27 U/L (ref 11–51)

## 2023-08-28 LAB — TSH: TSH: 1.069 u[IU]/mL (ref 0.350–4.500)

## 2023-08-28 LAB — MAGNESIUM: Magnesium: 2.2 mg/dL (ref 1.7–2.4)

## 2023-08-28 MED ORDER — IOHEXOL 300 MG/ML  SOLN
100.0000 mL | Freq: Once | INTRAMUSCULAR | Status: AC | PRN
  Administered 2023-08-29: 100 mL via INTRAVENOUS

## 2023-08-28 MED ORDER — SODIUM CHLORIDE 0.9 % IV SOLN: INTRAVENOUS | Status: AC

## 2023-08-28 MED ORDER — ACETAMINOPHEN 325 MG PO TABS: 650.0000 mg | ORAL_TABLET | Freq: Four times a day (QID) | ORAL | Status: DC | PRN

## 2023-08-28 MED ORDER — PHENYTOIN SODIUM EXTENDED 100 MG PO CAPS
100.0000 mg | ORAL_CAPSULE | Freq: Two times a day (BID) | ORAL | Status: DC
  Administered 2023-08-29: 100 mg via ORAL
  Filled 2023-08-28 (×2): qty 1

## 2023-08-28 MED ORDER — LEVETIRACETAM 500 MG PO TABS
1500.0000 mg | ORAL_TABLET | Freq: Two times a day (BID) | ORAL | Status: DC
  Administered 2023-08-29: 1500 mg via ORAL
  Filled 2023-08-28 (×2): qty 3

## 2023-08-28 MED ORDER — LATANOPROST 0.005 % OP SOLN
1.0000 [drp] | Freq: Every day | OPHTHALMIC | Status: DC
  Administered 2023-08-28: 1 [drp] via OPHTHALMIC
  Filled 2023-08-28: qty 2.5

## 2023-08-28 MED ORDER — ONDANSETRON HCL 4 MG PO TABS
4.0000 mg | ORAL_TABLET | Freq: Four times a day (QID) | ORAL | Status: DC | PRN
Start: 2023-08-28 — End: 2023-08-29

## 2023-08-28 MED ORDER — CARVEDILOL 3.125 MG PO TABS
6.2500 mg | ORAL_TABLET | Freq: Two times a day (BID) | ORAL | Status: DC
  Administered 2023-08-29: 6.25 mg via ORAL
  Filled 2023-08-28: qty 2

## 2023-08-28 MED ORDER — FAMOTIDINE 20 MG PO TABS
20.0000 mg | ORAL_TABLET | Freq: Two times a day (BID) | ORAL | Status: DC
Start: 2023-08-29 — End: 2023-08-29
  Administered 2023-08-29: 20 mg via ORAL
  Filled 2023-08-28: qty 1

## 2023-08-28 MED ORDER — POLYETHYLENE GLYCOL 3350 17 G PO PACK: 17.0000 g | PACK | Freq: Every day | ORAL | Status: DC | PRN

## 2023-08-28 MED ORDER — ONDANSETRON HCL 4 MG/2ML IJ SOLN: 4.0000 mg | Freq: Four times a day (QID) | INTRAMUSCULAR | Status: DC | PRN

## 2023-08-28 MED ORDER — LORAZEPAM 2 MG/ML IJ SOLN
1.0000 mg | Freq: Once | INTRAMUSCULAR | Status: DC
Start: 2023-08-28 — End: 2023-08-28
  Filled 2023-08-28: qty 1

## 2023-08-28 MED ORDER — LAMOTRIGINE 25 MG PO TABS
150.0000 mg | ORAL_TABLET | Freq: Two times a day (BID) | ORAL | Status: DC
  Administered 2023-08-29: 150 mg via ORAL
  Filled 2023-08-28 (×2): qty 2

## 2023-08-28 MED ORDER — ENOXAPARIN SODIUM 40 MG/0.4ML IJ SOSY
40.0000 mg | PREFILLED_SYRINGE | INTRAMUSCULAR | Status: DC
  Administered 2023-08-28: 40 mg via SUBCUTANEOUS
  Filled 2023-08-28: qty 0.4

## 2023-08-28 MED ORDER — ACETAMINOPHEN 650 MG RE SUPP: 650.0000 mg | Freq: Four times a day (QID) | RECTAL | Status: DC | PRN

## 2023-08-28 MED ORDER — LORAZEPAM 2 MG/ML IJ SOLN
2.0000 mg | Freq: Once | INTRAMUSCULAR | Status: AC
  Administered 2023-08-28: 2 mg via INTRAMUSCULAR

## 2023-08-28 NOTE — Progress Notes (Signed)
   08/28/23 2014  TOC Brief Assessment  Insurance and Status Reviewed  Patient has primary care physician Yes  Home environment has been reviewed From home  Prior level of function: Dependent  Prior/Current Home Services Current home services  Social Drivers of Health Review SDOH reviewed no interventions necessary  Readmission risk has been reviewed Yes  Transition of care needs no transition of care needs at this time   Pt c/cerebral palsy c/cognitive deficit. From home, lives c/sister and brother. Has an aide through [facility] M-F, 8 hrs/day. Non-ambulatory. Has hospital bed, Hoyer lift, electric wc. Sister states she needs an over-bed tray/table. No preference for DME provider. Family plans for pt to be discharged home.

## 2023-08-28 NOTE — Assessment & Plan Note (Addendum)
 Possibly incidental finding results etiology of failure to thrive.  - CT A/P wo Contrast- shows partially exophytic heterogeneous at least 4.1 x 5.0 cm mass arising from the right kidney interpolar region, anteriorly which is highly concerning for neoplastic process. Further evaluation with contrast-enhanced MRI abdomen or CT scan abdomen, as per renal mass protocol is recommended. - I have explained this workup is usually completed as outpatient, but caregiver requesting admission for workup to be completed due to challenges transporting patient to and from appointments.

## 2023-08-28 NOTE — ED Provider Notes (Signed)
   ED Course / MDM   Clinical Course as of 08/28/23 1800  Tue Aug 28, 2023  1157 Patient is fighting with getting IV placed.  Have ordered her some IM Ativan. [MB]  1439 Reviewed results with sister.  She said she would not want her to be placed and does not think she would want her to have a feeding tube.  She did ask if we can get a CT abdomen and pelvis so she would be sure that there was not something going on in the abdomen that is keeping her from eating. [MB]  1519 Received sign out from Dr. Randal Bury pending CT scan. Patient not eating or drinking much, has lost some weight. CT head and CXR reassuring. If CT negative likely discharge [WS]  1758 CT scan shows new renal mass.  Not sure if this would be the cause of the patient's symptoms, however discussed inpatient versus outpatient workup, patient's sister and caregiver would prefer that patient is admitted for further workup as it is very difficult to get her to and from the hospital.  I think this is reasonable.  Discussed with the hospitalist who has admitted the patient for further workup. [WS]    Clinical Course User Index [MB] Tonya Fredrickson, MD [WS] Mordecai Applebaum, MD   Medical Decision Making Amount and/or Complexity of Data Reviewed Labs: ordered. Radiology: ordered.  Risk Prescription drug management. Decision regarding hospitalization.       Mordecai Applebaum, MD 08/28/23 1800
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
## 2023-08-28 NOTE — Care Management Obs Status (Signed)
 MEDICARE OBSERVATION STATUS NOTIFICATION   Patient Details  Name: LUTTIE SPARHAWK MRN: [id number] Date of Birth: Feb 06, 1956   Medicare Observation Status Notification Given:  Yes    Geraldina Klinefelter, RN 08/28/2023, 7:47 PM

## 2023-08-28 NOTE — Assessment & Plan Note (Signed)
 Patient currently bedbound due to difficulty ambulating patient with wheelchair as she is severely rotated.

## 2023-08-28 NOTE — H&P (Signed)
 History and Physical    [name] ZOX:[id number] DOB: 1956/03/06 DOA: 08/28/2023  PCP: Wilburn Handler, MD   Patient coming from: Home  I have personally briefly reviewed patient's old medical records in [facility]  Chief Complaint: Failure to thrive  HPI: [name] is a 68 y.o. female with medical history significant for cerebral palsy, seizures, hypertension.  Patient was brought to the ED by her sister Lenon Radar who is her caregiver.  History is obtained from patient's sister, due to patient's baseline mental deficits and slurred speech from CP. Patient's sister reports that over the past month, patient has had reduced oral intake with > 20 pound weight loss.  No vomiting, no diarrhea.  Has not complain of pain or difficulty swallowing.  She just does not have appetite to eat.  No complaints of pain, no urinary symptoms-she uses an adult diaper.  Patient mostly bedbound or uses wheelchair which is difficult because patient is significantly rotated and bent over. At home, patient has intermittent episodes of agitation-spitting, hitting she is being defiant about something, otherwise she is cooperative.  Family history of breast cancer in caregiver -her sister, and prostate cancer in brother.  ED Course: Temperature 97.5.  Heart rate 80s to 90s, respirate rate 14-20.  Blood pressure 89-119 systolic.  O2 sat greater than 95% on room air. Troponin < 2.  Magnesium 2.2.  UA not suggestive of UTI.  CT abdomen and pelvis without contrast- There is a partially exophytic heterogeneous at least 4.1 x 5.0 cm mass arising from the right kidney interpolar region, anteriorly which is highly concerning for neoplastic process. Further evaluation with contrast-enhanced MRI abdomen or CT scan abdomen, as per renal mass protocol is recommended. Hospitalization requested for failure to thrive, and caregiver request as patient is nonambulatory, so it is challenging for her- the caregiver to transport  patient to and from appointments as outpatient.   Review of Systems: As per HPI all other systems reviewed and negative.  Past Medical History:  Diagnosis Date   Cerebral palsy (HCC)    Epilepsy (HCC)    Hypertension    Seizures (HCC)     Past Surgical History:  Procedure Laterality Date   ABDOMINAL HYSTERECTOMY     arm surgery     BACK SURGERY     BREAST SURGERY     cyst removal     reports that she has never smoked. She has never used smokeless tobacco. She reports that she does not drink alcohol and does not use drugs.  Allergies  Allergen Reactions   Aspirin    Benadryl [Diphenhydramine Hcl (Sleep)]    Codeine    Phenobarbital     Family History  Problem Relation Age of Onset   Transient ischemic attack Mother     Prior to Admission medications   Medication Sig Start Date End Date Taking? Authorizing Provider  amLODipine (NORVASC) 5 MG tablet Take 5 mg by mouth daily.    [provider]  betamethasone, augmented, (DIPROLENE) 0.05 % lotion Apply topically 2 (two) times daily.    [provider]  carvedilol (COREG) 6.25 MG tablet Take 6.25 mg by mouth 2 (two) times daily with a meal.    [provider]  cephALEXin  (KEFLEX ) 500 MG capsule Take 1 capsule (500 mg total) by mouth 4 (four) times daily. 05/31/23   Cheyenne Cotta, MD  DILANTIN  100 MG ER capsule TAKE (1) CAPSULE BY MOUTH TWICE DAILY. 08/22/23   Lisabeth Rider, MD  famotidine  (PEPCID ) 20 MG tablet Take 1 tablet (20 mg total) by mouth 2 (two) times daily. 05/31/23   Zammit, Joseph, MD  hydrochlorothiazide (HYDRODIURIL) 25 MG tablet Take 25 mg by mouth daily. 12.5 mg daily    [provider]  hydrOXYzine  (ATARAX /VISTARIL ) 25 MG tablet  01/08/15   [provider]  KEPPRA  750 MG tablet Take 2 tablets (1,500 mg total) by mouth 2 (two) times daily. 08/29/22   Lisabeth Rider, MD  LAMICTAL  150 MG tablet Take 1 tablet (150 mg total) by mouth 2 (two) times daily. 06/11/23   Sethi,  Pramod S, MD  latanoprost (XALATAN) 0.005 % ophthalmic solution Place 1 drop into both eyes at bedtime. 07/11/23   [provider]  loratadine  (CLARITIN ) 10 MG tablet Take 1 tablet (10 mg total) by mouth daily. One po daily x 5 days 05/31/23   Zammit, Joseph, MD  mupirocin ointment (BACTROBAN) 2 % Place 1 application into the nose 2 (two) times daily.    [provider]  nystatin  cream (MYCOSTATIN ) Apply topically 2 (two) times daily. 10/07/12   Constant, Peggy, MD  predniSONE  (DELTASONE ) 10 MG tablet Take 2 tablets (20 mg total) by mouth daily. 05/31/23   Zammit, Joseph, MD  simvastatin (ZOCOR) 10 MG tablet Take 10 mg by mouth at bedtime.    [provider]    Physical Exam: Vitals:   08/28/23 1500 08/28/23 1515 08/28/23 1530 08/28/23 1627  BP: (!) 117/56 (!) 116/59 (!) 111/55   Pulse: 97 88 92   Resp:      Temp:    97.9 F (36.6 C)  TempSrc:    Oral  SpO2: 100% 96% 95%     Constitutional: Patient is significantly rotated to the left side and anteriorly,hence limiting examination, he had repeated Vitals:   08/28/23 1500 08/28/23 1515 08/28/23 1530 08/28/23 1627  BP: (!) 117/56 (!) 116/59 (!) 111/55   Pulse: 97 88 92   Resp:      Temp:    97.9 F (36.6 C)  TempSrc:    Oral  SpO2: 100% 96% 95%    Eyes: PERRL, lids and conjunctivae normal ENMT: Mucous membranes are moist.  Neck: normal, supple, no masses, no thyromegaly Respiratory: clear to auscultation bilaterally, no wheezing, no crackles. Normal respiratory effort. No accessory muscle use.  Cardiovascular: Regular rate and rhythm, no murmurs / rubs / gallops. No extremity edema.  Abdomen: no tenderness, no masses palpated. No hepatosplenomegaly. Bowel sounds positive.  Musculoskeletal: no clubbing / cyanosis.  Cerebral palsy  Skin: no rashes, lesions, ulcers. No induration Neurologic: No facial asymmetry, speech slightly slurred, cerebral palsy.  Psychiatric:  patient not cooperative with exam, wants  to be discharged home  Labs on Admission: I have personally reviewed following labs and imaging studies  CBC: Recent Labs  Lab 08/28/23 1137  WBC 4.8  NEUTROABS 2.8  HGB 11.8*  HCT 36.6  MCV 101.4*  PLT 256   Basic Metabolic Panel: Recent Labs  Lab 08/28/23 1137  NA 139  K 3.8  CL 98  CO2 31  GLUCOSE 105*  BUN 13  CREATININE 0.48  CALCIUM 9.5  MG 2.2   GFR: CrCl cannot be calculated (Unknown ideal weight.). Liver Function Tests: Recent Labs  Lab 08/28/23 1137  AST 14*  ALT 7  ALKPHOS 131*  BILITOT 0.2  PROT 6.6  ALBUMIN 3.5   Recent Labs  Lab 08/28/23 1137  LIPASE 27   Urine analysis:    Component Value  Date/Time   COLORURINE YELLOW 08/28/2023 1154   APPEARANCEUR CLEAR 08/28/2023 1154   LABSPEC 1.021 08/28/2023 1154   PHURINE 7.0 08/28/2023 1154   GLUCOSEU NEGATIVE 08/28/2023 1154   HGBUR NEGATIVE 08/28/2023 1154   BILIRUBINUR NEGATIVE 08/28/2023 1154   KETONESUR NEGATIVE 08/28/2023 1154   PROTEINUR 30 (A) 08/28/2023 1154   UROBILINOGEN 0.2 08/05/2013 2057   NITRITE NEGATIVE 08/28/2023 1154   LEUKOCYTESUR NEGATIVE 08/28/2023 1154    Radiological Exams on Admission: CT ABDOMEN PELVIS WO CONTRAST Result Date: 08/28/2023 CLINICAL DATA:  Abdominal pain, acute, nonlocalized. EXAM: CT ABDOMEN AND PELVIS WITHOUT CONTRAST TECHNIQUE: Multidetector CT imaging of the abdomen and pelvis was performed following the standard protocol without IV contrast. RADIATION DOSE REDUCTION: This exam was performed according to the departmental dose-optimization program which includes automated exposure control, adjustment of the mA and/or kV according to patient size and/or use of iterative reconstruction technique. COMPARISON:  CT scan abdomen from 08/29/2007. FINDINGS: Lower chest: There are patchy areas of linear, plate-like atelectasis and/or scarring throughout bilateral lungs. The lung bases are otherwise clear. No pleural effusion. The heart is normal in size. No  pericardial effusion. Hepatobiliary: The liver is normal in size. Non-cirrhotic configuration. There are at least 2 hypoattenuating structures in the liver with largest in the left hepatic lobe, segment 3 measuring 1.7 x 2.4 cm, which can be characterized as a simple cyst. The other smaller lesion is too small to adequately characterize (series 3, image 29). No intrahepatic or extrahepatic bile duct dilation. No calcified gallstones. Normal gallbladder wall thickness. No pericholecystic inflammatory changes. Pancreas: Unremarkable. No pancreatic ductal dilatation or surrounding inflammatory changes. Spleen: Within normal limits. No focal lesion. Adrenals/Urinary Tract: Unremarkable left adrenal gland. Right adrenal gland is not visualized. There is a partially exophytic heterogeneous at least 4.1 x 5.0 cm mass arising from the right kidney interpolar region, anteriorly which is highly concerning for neoplastic process. Limited evaluation of left kidney due to extensive streak artifacts from spinal hardware. However, no significant mass seen in the left kidney. Unremarkable urinary bladder. Stomach/Bowel: No disproportionate dilation of the small or large bowel loops. No evidence of abnormal bowel wall thickening or inflammatory changes. The appendix is unremarkable. Moderate stool burden noted. There is fecal material within the anal canal, which appears patulous and there is frothy material in the natal cleft region, suggesting probable fecal incontinence during scanning. Vascular/Lymphatic: No ascites or pneumoperitoneum. No abdominal or pelvic lymphadenopathy, by size criteria. No aneurysmal dilation of the major abdominal arteries. There are mild peripheral atherosclerotic vascular calcifications of the aorta and its major branches. Reproductive: The uterus is surgically absent. No large adnexal mass. Other: The visualized soft tissues and abdominal wall are within normal limits. Musculoskeletal: No suspicious  osseous lesions. Thoracolumbar spinal fixation hardware noted. There is diffuse osteopenia of the spine and probable ankylosis, not well evaluated due to patient related factors. IMPRESSION: 1. Overall examination is moderately limited due to patient related factors. 2. No acute inflammatory process identified within the abdomen or pelvis. 3. There is a partially exophytic heterogeneous at least 4.1 x 5.0 cm mass arising from the right kidney interpolar region, anteriorly which is highly concerning for neoplastic process. Further evaluation with contrast-enhanced MRI abdomen or CT scan abdomen, as per renal mass protocol is recommended. 4. Multiple other nonacute observations, as described above. 5. Aortic atherosclerosis. Aortic Atherosclerosis (ICD10-I70.0). Electronically Signed   By: Beula Brunswick M.D.   On: 08/28/2023 17:17   CT Head Wo Contrast Result Date: 08/28/2023  CLINICAL DATA:  Mental status change of unknown cause. Hallucinations. EXAM: CT HEAD WITHOUT CONTRAST TECHNIQUE: Contiguous axial images were obtained from the base of the skull through the vertex without intravenous contrast. RADIATION DOSE REDUCTION: This exam was performed according to the departmental dose-optimization program which includes automated exposure control, adjustment of the mA and/or kV according to patient size and/or use of iterative reconstruction technique. COMPARISON:  10/31/2006 FINDINGS: Brain: No change when compared to the study of 2008. Cerebellar atrophy without focal insult. Left cerebral hemisphere shows mild volume loss but no focal insult. On the right, there is a small cerebral hemisphere which is probably longstanding. There is focal encephalomalacia in the right insula, basal ganglia and frontoparietal junction region. This probably relates to old vascular insult. No acute stroke, mass, hemorrhage, hydrocephalus or extra-axial collection. Vascular: There is atherosclerotic calcification of the major vessels  at the base of the brain. Skull: No acute or focal finding. Sinuses/Orbits: Clear/normal Other: None IMPRESSION: No acute CT finding. Cerebellar atrophy. Small right cerebral hemisphere with focal encephalomalacia in the right insula, basal ganglia and frontoparietal junction region. This probably relates to old vascular insult. Electronically Signed   By: Bettylou Brunner M.D.   On: 08/28/2023 13:21   DG Chest Port 1 View Result Date: 08/28/2023 CLINICAL DATA:  Weakness.  Failure to thrive. EXAM: PORTABLE CHEST 1 VIEW COMPARISON:  08/05/2013 FINDINGS: Markedly rotated film. Due to rotation, cardiomediastinal anatomy obscures the left base. Interstitial markings are diffusely coarsened with chronic features. No definite airspace edema. No appreciable pleural effusion. Bones are diffusely demineralized. Thoracolumbar fusion hardware evident. Telemetry leads overlie the chest. IMPRESSION: 1. Markedly rotated film. 2. Chronic interstitial coarsening without definite acute cardiopulmonary findings. Electronically Signed   By: Donnal Fusi M.D.   On: 08/28/2023 12:01   EKG: Independently reviewed.  Sinus rhythm, rate 54, QTc 461.  No significant abnormalities.  Assessment/Plan Principal Problem:   Failure to thrive in adult Active Problems:   Right renal mass   Partial epilepsy with impairment of consciousness, intractable (HCC)   Infantile cerebral palsy (HCC)   HTN (hypertension)   Assessment and Plan: * Failure to thrive in adult Reduced oral intake over the past month, with subsequent at least 20 pound weight loss by sister.   No GI losses.  Vitals stable.  Doubt infectious etiology.  CT showing renal mass which could be etiology versus declining cognitive status. -Check TSH - Patient's caregiver does not wants placement, ok with patient being discharged back home. - N/s 75cc/hr x 12 hrs  Right renal mass Possibly incidental finding results etiology of failure to thrive.  - CT A/P wo Contrast-  shows partially exophytic heterogeneous at least 4.1 x 5.0 cm mass arising from the right kidney interpolar region, anteriorly which is highly concerning for neoplastic process. Further evaluation with contrast-enhanced MRI abdomen or CT scan abdomen, as per renal mass protocol is recommended. - I have explained this workup is usually completed as outpatient, but caregiver requesting admission for workup to be completed due to challenges transporting patient to and from appointments.  Infantile cerebral palsy [facility]) Patient currently bedbound due to difficulty ambulating patient with wheelchair as she is severely rotated.  Partial epilepsy with impairment of consciousness, intractable (HCC) Resume Dilantin , Keppra , Lamictal   HTN (hypertension) Blood pressure soft hold HCTZ and Norvasc.   DVT prophylaxis: Lovenox Code Status: FULL Code-family Sister Lenon Radar who is caregiver at bedside. Family Communication:  Sister Lenon Radar who is caregiver at bedside Disposition Plan: ~  2 days Consults called: None  Admission status: Obs med-surg    Author: Pati Bonine, MD 08/28/2023 8:55 PM  For on call review [email or web address].

## 2023-08-28 NOTE — ED Provider Notes (Signed)
 [facility]ERGENC[facility] DEPARTMENT AT [facility] Provider Note   CSN: [id number] Arrival date & time: 08/28/23  1043     History  Chief Complaint  Patient presents with   Failure To Thrive    [name] is a 68 y.o. female.  Patient brought in by ambulance from home.  Sister is primary caregiver and is giving most of the history.  She said for the last month or so she has been not really eating or drinking anything.  Continues to take her medications.  She has had on and off hallucinations thinking somebody else is there and speaking with them.  PCP did a home visit yesterday and found her to be very weak and losing weight, recommended she come to the emergency department.  Patient herself denies any complaints.  She cannot tell me why she is not eating or drinking.  She has a prior history of cerebral palsy which is left her with some use of her right upper extremity minimal use of her left upper extremity and minimal use of her legs.  She is mostly in bed or in a chair and they use of a Hoyer lift to move her.  Does not really walk.  Incontinent of urine and stool.   The history is provided by the patient and a relative.  Weakness Severity:  Moderate Onset quality:  Gradual Duration:  4 weeks Timing:  Constant Progression:  Worsening Chronicity:  New Relieved by:  Nothing Associated symptoms: difficulty walking   Associated symptoms: no abdominal pain, no chest pain, no cough, no falls, no fever and no shortness of breath        Home Medications Prior to Admission medications   Medication Sig Start Date End Date Taking? Authorizing Provider  amLODipine (NORVASC) 5 MG tablet Take 5 mg by mouth daily.    [provider]  betamethasone, augmented, (DIPROLENE) 0.05 % lotion Apply topically 2 (two) times daily.    [provider]  carvedilol (COREG) 6.25 MG tablet Take 6.25 mg by mouth 2 (two) times daily with a meal.    [provider]   cephALEXin  (KEFLEX ) 500 MG capsule Take 1 capsule (500 mg total) by mouth 4 (four) times daily. 05/31/23   Cheyenne Cotta, MD  DILANTIN  100 MG ER capsule TAKE (1) CAPSULE BY MOUTH TWICE DAILY. 08/22/23   Lisabeth Rider, MD  famotidine  (PEPCID ) 20 MG tablet Take 1 tablet (20 mg total) by mouth 2 (two) times daily. 05/31/23   Zammit, Joseph, MD  hydrochlorothiazide (HYDRODIURIL) 25 MG tablet Take 25 mg by mouth daily. 12.5 mg daily    [provider]  hydrOXYzine  (ATARAX /VISTARIL ) 25 MG tablet  01/08/15   [provider]  KEPPRA  750 MG tablet Take 2 tablets (1,500 mg total) by mouth 2 (two) times daily. 08/29/22   Sethi, Pramod S, MD  LAMICTAL  150 MG tablet Take 1 tablet (150 mg total) by mouth 2 (two) times daily. 06/11/23   Lisabeth Rider, MD  loratadine  (CLARITIN ) 10 MG tablet Take 1 tablet (10 mg total) by mouth daily. One po daily x 5 days 05/31/23   Zammit, Joseph, MD  mupirocin ointment (BACTROBAN) 2 % Place 1 application into the nose 2 (two) times daily.    [provider]  nystatin  cream (MYCOSTATIN ) Apply topically 2 (two) times daily. 10/07/12   Constant, Peggy, MD  predniSONE  (DELTASONE ) 10 MG tablet Take 2 tablets (20 mg total) by mouth daily. 05/31/23   Zammit,  Drexel Gentles, MD  simvastatin (ZOCOR) 10 MG tablet Take 10 mg by mouth at bedtime.    [provider]      Allergies    Aspirin, Benadryl [diphenhydramine hcl (sleep)], Codeine, and Phenobarbital    Review of Systems   Review of Systems  Constitutional:  Negative for fever.  Respiratory:  Negative for cough and shortness of breath.   Cardiovascular:  Negative for chest pain.  Gastrointestinal:  Negative for abdominal pain.  Musculoskeletal:  Negative for falls.  Neurological:  Positive for weakness.    Physical Exam Updated Vital Signs BP (!) 114/55   Pulse 89   Temp (!) 97.5 F (36.4 C) (Oral)   Resp 14   SpO2 99%  Physical Exam Vitals and nursing note reviewed.  Constitutional:       General: She is not in acute distress.    Appearance: Normal appearance. She is well-developed.  HENT:     Head: Normocephalic and atraumatic.  Eyes:     Conjunctiva/sclera: Conjunctivae normal.  Cardiovascular:     Rate and Rhythm: Normal rate and regular rhythm.     Heart sounds: No murmur heard. Pulmonary:     Effort: Pulmonary effort is normal. No respiratory distress.     Breath sounds: Normal breath sounds. No stridor. No wheezing.  Abdominal:     Palpations: Abdomen is soft.     Tenderness: There is no abdominal tenderness. There is no guarding or rebound.  Musculoskeletal:        General: No tenderness.     Cervical back: Neck supple.  Skin:    General: Skin is warm and dry.  Neurological:     Mental Status: She is alert.     GCS: GCS eye subscore is 4. GCS verbal subscore is 5. GCS motor subscore is 6.     Comments: Patient is awake and alert.  She can grip a little bit with her right hand and raise her right arm.  Can minimally lose her left arm and can wiggle her feet.  Seems more weak on the left than right.     ED Results / Procedures / Treatments   Labs (all labs ordered are listed, but only abnormal results are displayed) Labs Reviewed  COMPREHENSIVE METABOLIC PANEL WITH GFR - Abnormal; Notable for the following components:      Result Value   Glucose, Bld 105 (*)    AST 14 (*)    Alkaline Phosphatase 131 (*)    All other components within normal limits  CBC WITH DIFFERENTIAL/PLATELET - Abnormal; Notable for the following components:   RBC 3.61 (*)    Hemoglobin 11.8 (*)    MCV 101.4 (*)    All other components within normal limits  URINALYSIS, ROUTINE W REFLEX MICROSCOPIC - Abnormal; Notable for the following components:   Protein, ur 30 (*)    All other components within normal limits  LIPASE, BLOOD  MAGNESIUM  TROPONIN I (HIGH SENSITIVITY)  TROPONIN I (HIGH SENSITIVITY)    EKG EKG Interpretation Date/Time:  Tuesday August 28 2023 11:03:21  EDT Ventricular Rate:  84 PR Interval:  193 QRS Duration:  93 QT Interval:  390 QTC Calculation: 461 R Axis:   182  Text Interpretation: Sinus rhythm Right axis deviation Abnormal R-wave progression, early transition Minimal ST depression Confirmed by Racheal Buddle [phone]) on 08/28/2023 12:09:01 PM  Radiology CT Head Wo Contrast Result Date: 08/28/2023 CLINICAL DATA:  Mental status change of unknown cause. Hallucinations. EXAM: CT HEAD WITHOUT  CONTRAST TECHNIQUE: Contiguous axial images were obtained from the base of the skull through the vertex without intravenous contrast. RADIATION DOSE REDUCTION: This exam was performed according to the departmental dose-optimization program which includes automated exposure control, adjustment of the mA and/or kV according to patient size and/or use of iterative reconstruction technique. COMPARISON:  10/31/2006 FINDINGS: Brain: No change when compared to the study of 2008. Cerebellar atrophy without focal insult. Left cerebral hemisphere shows mild volume loss but no focal insult. On the right, there is a small cerebral hemisphere which is probably longstanding. There is focal encephalomalacia in the right insula, basal ganglia and frontoparietal junction region. This probably relates to old vascular insult. No acute stroke, mass, hemorrhage, hydrocephalus or extra-axial collection. Vascular: There is atherosclerotic calcification of the major vessels at the base of the brain. Skull: No acute or focal finding. Sinuses/Orbits: Clear/normal Other: None IMPRESSION: No acute CT finding. Cerebellar atrophy. Small right cerebral hemisphere with focal encephalomalacia in the right insula, basal ganglia and frontoparietal junction region. This probably relates to old vascular insult. Electronically Signed   By: Bettylou Brunner M.D.   On: 08/28/2023 13:21   DG Chest Port 1 View Result Date: 08/28/2023 CLINICAL DATA:  Weakness.  Failure to thrive. EXAM: PORTABLE CHEST 1 VIEW  COMPARISON:  08/05/2013 FINDINGS: Markedly rotated film. Due to rotation, cardiomediastinal anatomy obscures the left base. Interstitial markings are diffusely coarsened with chronic features. No definite airspace edema. No appreciable pleural effusion. Bones are diffusely demineralized. Thoracolumbar fusion hardware evident. Telemetry leads overlie the chest. IMPRESSION: 1. Markedly rotated film. 2. Chronic interstitial coarsening without definite acute cardiopulmonary findings. Electronically Signed   By: Donnal Fusi M.D.   On: 08/28/2023 12:01    Procedures Procedures    Medications Ordered in ED Medications  iohexol (OMNIPAQUE) 300 MG/ML solution 100 mL (has no administration in time range)  LORazepam (ATIVAN) injection 2 mg (2 mg Intramuscular Given 08/28/23 1202)    ED Course/ Medical Decision Making/ A&P Clinical Course as of 08/28/23 1649  Tue Aug 28, 2023  1157 Patient is fighting with getting IV placed.  Have ordered her some IM Ativan. [MB]  1439 Reviewed results with sister.  She said she would not want her to be placed and does not think she would want her to have a feeding tube.  She did ask if we can get a CT abdomen and pelvis so she would be sure that there was not something going on in the abdomen that is keeping her from eating. [MB]  1519 Received sign out from Dr. Randal Bury pending CT scan. Patient not eating or drinking much, has lost some weight. CT head and CXR reassuring. If CT negative likely discharge [WS]    Clinical Course User Index [MB] Tonya Fredrickson, MD [WS] Mordecai Applebaum, MD                                 Medical Decision Making Amount and/or Complexity of Data Reviewed Labs: ordered. Radiology: ordered.  Risk Prescription drug management.   This patient complains of failure to thrive poor intake new hallucinations; this involves an extensive number of treatment Options and is a complaint that carries with it a high risk of complications  and morbidity. The differential includes dementia, stroke, bleed, mental health issue, metabolic derangement, infection  I ordered, reviewed and interpreted labs, which included CBC with normal white count mildly low hemoglobin, chemistries  and LFTs unremarkable, urinalysis without clear signs of infection does have some hematuria, troponins flat I ordered medication IV Ativan for patient's agitation and reviewed PMP when indicated. I ordered imaging studies which included chest x-ray, CT head and I independently    visualized and interpreted imaging which showed no acute findings Additional history obtained from patient's sister Previous records obtained and reviewed in epic, no recent ED admissions Cardiac monitoring reviewed, normal sinus rhythm Social determinants considered, no significant barriers Critical Interventions: None  After the interventions stated above, I reevaluated the patient and found patient still to be rather tired appearing and not wanting to interact Admission and further testing considered, I have ordered a CT abdomen and pelvis and reviewed the workup with the sister.  Signed her care out to Dr. Isaiah Marc to follow-up on results of CT and further discuss with sister whether patient goes home or would need admission         Final Clinical Impression(s) / ED Diagnoses Final diagnoses:  Failure to thrive in adult  Hallucinations    Rx / DC Orders ED Discharge Orders     None         Tonya Fredrickson, MD 08/28/23 [phone]

## 2023-08-28 NOTE — ED Triage Notes (Signed)
 Pt BIB [facility] from home for c/o failure to thrive; pt has not been eating and has lost approximately 20lbs in the last month  Pt is having A/V hallucinations  BP 121/61 P 94 Cbg 151  Pt has Caretaker at bedside

## 2023-08-28 NOTE — Assessment & Plan Note (Addendum)
 Reduced oral intake over the past month, with subsequent at least 20 pound weight loss by sister.   No GI losses.  Vitals stable.  Doubt infectious etiology.  CT showing renal mass which could be etiology versus declining cognitive status. -Check TSH - Patient's caregiver does not wants placement, ok with patient being discharged back home. - N/s 75cc/hr x 12 hrs

## 2023-08-28 NOTE — Assessment & Plan Note (Signed)
 Blood pressure soft hold HCTZ and Norvasc.

## 2023-08-28 NOTE — Assessment & Plan Note (Signed)
 Resume Dilantin , Keppra , Lamictal 

## 2023-08-29 ENCOUNTER — Observation Stay (HOSPITAL_COMMUNITY)

## 2023-08-29 ENCOUNTER — Other Ambulatory Visit: Payer: Self-pay | Admitting: Neurology

## 2023-08-29 DIAGNOSIS — N2889 Other specified disorders of kidney and ureter: Secondary | ICD-10-CM | POA: Diagnosis not present

## 2023-08-29 DIAGNOSIS — Z743 Need for continuous supervision: Secondary | ICD-10-CM | POA: Diagnosis not present

## 2023-08-29 DIAGNOSIS — Z79899 Other long term (current) drug therapy: Secondary | ICD-10-CM | POA: Diagnosis not present

## 2023-08-29 DIAGNOSIS — Z993 Dependence on wheelchair: Secondary | ICD-10-CM | POA: Diagnosis not present

## 2023-08-29 DIAGNOSIS — R627 Adult failure to thrive: Secondary | ICD-10-CM | POA: Diagnosis not present

## 2023-08-29 DIAGNOSIS — I823 Embolism and thrombosis of renal vein: Secondary | ICD-10-CM | POA: Diagnosis not present

## 2023-08-29 DIAGNOSIS — G40119 Localization-related (focal) (partial) symptomatic epilepsy and epileptic syndromes with simple partial seizures, intractable, without status epilepticus: Secondary | ICD-10-CM | POA: Diagnosis not present

## 2023-08-29 DIAGNOSIS — I10 Essential (primary) hypertension: Secondary | ICD-10-CM | POA: Diagnosis not present

## 2023-08-29 DIAGNOSIS — R69 Illness, unspecified: Secondary | ICD-10-CM | POA: Diagnosis not present

## 2023-08-29 DIAGNOSIS — K769 Liver disease, unspecified: Secondary | ICD-10-CM | POA: Diagnosis not present

## 2023-08-29 DIAGNOSIS — G809 Cerebral palsy, unspecified: Secondary | ICD-10-CM | POA: Diagnosis not present

## 2023-08-29 DIAGNOSIS — C641 Malignant neoplasm of right kidney, except renal pelvis: Secondary | ICD-10-CM | POA: Insufficient documentation

## 2023-08-29 LAB — HIV ANTIBODY (ROUTINE TESTING W REFLEX): HIV Screen 4th Generation wRfx: NONREACTIVE

## 2023-08-29 MED ORDER — IOHEXOL 300 MG/ML  SOLN
75.0000 mL | Freq: Once | INTRAMUSCULAR | Status: DC | PRN
Start: 2023-08-29 — End: 2023-08-29

## 2023-08-29 NOTE — Discharge Summary (Signed)
 Physician Discharge Summary  [name] XBJ:[id number] DOB: 1955-08-04 DOA: 08/28/2023  PCP: Wilburn Handler, MD  Admit date: 08/28/2023  Discharge date: 08/29/2023  Admitted From:Home  Disposition:  Home  Recommendations for Outpatient Follow-up:  Follow up with PCP in 1-2 weeks Follow-up with urology for evaluation of renal mass with referral sent; CT abdomen with contrast ordered and performed prior to discharge Continue other home medications as prior  Home Health: None, has home health aide  Equipment/Devices: Wheelchair bound  Discharge Condition:Stable  CODE STATUS: Full  Diet recommendation: Heart Healthy  Brief/Interim Summary:  [name] is a 68 y.o. female with medical history significant for cerebral palsy, seizures, hypertension.  Patient was brought to the ED by her sister Lenon Radar who is her caregiver.  History is obtained from patient's sister, due to patient's baseline mental deficits and slurred speech from CP. Patient's sister reports that over the past month, patient has had reduced oral intake with > 20 pound weight loss.  No vomiting, no diarrhea.  Has not complain of pain or difficulty swallowing.  She just does not have appetite to eat.  No complaints of pain, no urinary symptoms-she uses an adult diaper.  Patient mostly bedbound or uses wheelchair which is difficult because patient is significantly rotated and bent over. At home, patient has intermittent episodes of agitation-spitting, hitting she is being defiant about something, otherwise she is cooperative.  Family history of breast cancer in caregiver -her sister, and prostate cancer in brother.  Patient was admitted for evaluation of failure to thrive and was noted to have CT findings of right renal mass.  She underwent CT contrast evaluation and further results will be evaluated by urology outpatient to consider possible surgical intervention.  Sister at bedside does not want to consider any palliative or  hospice considerations until further evaluation outpatient.  TSH within normal limits.  No other acute events or concerns noted.  Discharge Diagnoses:  Principal Problem:   Failure to thrive in adult Active Problems:   Right renal mass   Partial epilepsy with impairment of consciousness, intractable (HCC)   Infantile cerebral palsy (HCC)   HTN (hypertension)  Principal discharge diagnosis: Failure to thrive with noted right renal mass.  Discharge Instructions  Discharge Instructions     Ambulatory referral to Urology   Complete by: As directed    Diet - low sodium heart healthy   Complete by: As directed    Increase activity slowly   Complete by: As directed       Allergies as of 08/29/2023       Reactions   Aspirin Other (See Comments)   Unknown    Benadryl [diphenhydramine Hcl (sleep)] Other (See Comments)   Unknown    Codeine Other (See Comments)   Unknown    Other Swelling, Other (See Comments)   An antibiotic pt received last time she was here before 08-28-23 Lip swelling   Phenobarbital Other (See Comments)   Unknown         Medication List     TAKE these medications    acetaminophen 650 MG CR tablet Commonly known as: TYLENOL Take 1,300 mg by mouth every 8 (eight) hours as needed for pain.   amLODipine 5 MG tablet Commonly known as: NORVASC Take 5 mg by mouth daily.   ascorbic acid 500 MG tablet Commonly known as: VITAMIN C Take 500 mg by mouth daily.   carvedilol 6.25 MG tablet Commonly known as: COREG Take 6.25 mg by  mouth 2 (two) times daily with a meal.   D3 5000 125 MCG (5000 UT) capsule Generic drug: Cholecalciferol Take 5,000 Units by mouth daily.   Dilantin  100 MG ER capsule Generic drug: phenytoin  TAKE (1) CAPSULE BY MOUTH TWICE DAILY.   famotidine  20 MG tablet Commonly known as: Pepcid  Take 1 tablet (20 mg total) by mouth 2 (two) times daily.   hydrochlorothiazide 25 MG tablet Commonly known as: HYDRODIURIL Take 25 mg by  mouth daily. 12.5 mg daily   ibuprofen 200 MG tablet Commonly known as: ADVIL Take 200 mg by mouth every 6 (six) hours as needed for mild pain (pain score 1-3).   Keppra  750 MG tablet Generic drug: levETIRAcetam  Take 2 tablets (1,500 mg total) by mouth 2 (two) times daily.   LaMICtal  150 MG tablet Generic drug: lamoTRIgine  Take 1 tablet (150 mg total) by mouth 2 (two) times daily.   latanoprost 0.005 % ophthalmic solution Commonly known as: XALATAN Place 1 drop into both eyes at bedtime.   simvastatin 10 MG tablet Commonly known as: ZOCOR Take 10 mg by mouth at bedtime.        Follow-up Information     Wilburn Handler, MD. Schedule an appointment as soon as possible for a visit in 1 month(s).   Specialty: Family Medicine Contact information: [address] [place] [address] [phone]         [facility]. Go in 1 week(s).   Specialty: Urology Contact information: [address] [place]  [address] [phone]               Allergies  Allergen Reactions   Aspirin Other (See Comments)    Unknown    Benadryl [Diphenhydramine Hcl (Sleep)] Other (See Comments)    Unknown    Codeine Other (See Comments)    Unknown    Other Swelling and Other (See Comments)    An antibiotic pt received last time she was here before 08-28-23 Lip swelling   Phenobarbital Other (See Comments)    Unknown     Consultations: None   Procedures/Studies: CT ABDOMEN PELVIS WO CONTRAST Result Date: 08/28/2023 CLINICAL DATA:  Abdominal pain, acute, nonlocalized. EXAM: CT ABDOMEN AND PELVIS WITHOUT CONTRAST TECHNIQUE: Multidetector CT imaging of the abdomen and pelvis was performed following the standard protocol without IV contrast. RADIATION DOSE REDUCTION: This exam was performed according to the departmental dose-optimization program which includes automated exposure control, adjustment of the mA and/or kV according to  patient size and/or use of iterative reconstruction technique. COMPARISON:  CT scan abdomen from 08/29/2007. FINDINGS: Lower chest: There are patchy areas of linear, plate-like atelectasis and/or scarring throughout bilateral lungs. The lung bases are otherwise clear. No pleural effusion. The heart is normal in size. No pericardial effusion. Hepatobiliary: The liver is normal in size. Non-cirrhotic configuration. There are at least 2 hypoattenuating structures in the liver with largest in the left hepatic lobe, segment 3 measuring 1.7 x 2.4 cm, which can be characterized as a simple cyst. The other smaller lesion is too small to adequately characterize (series 3, image 29). No intrahepatic or extrahepatic bile duct dilation. No calcified gallstones. Normal gallbladder wall thickness. No pericholecystic inflammatory changes. Pancreas: Unremarkable. No pancreatic ductal dilatation or surrounding inflammatory changes. Spleen: Within normal limits. No focal lesion. Adrenals/Urinary Tract: Unremarkable left adrenal gland. Right adrenal gland is not visualized. There is a partially exophytic heterogeneous at least 4.1 x 5.0 cm mass arising from the right  kidney interpolar region, anteriorly which is highly concerning for neoplastic process. Limited evaluation of left kidney due to extensive streak artifacts from spinal hardware. However, no significant mass seen in the left kidney. Unremarkable urinary bladder. Stomach/Bowel: No disproportionate dilation of the small or large bowel loops. No evidence of abnormal bowel wall thickening or inflammatory changes. The appendix is unremarkable. Moderate stool burden noted. There is fecal material within the anal canal, which appears patulous and there is frothy material in the natal cleft region, suggesting probable fecal incontinence during scanning. Vascular/Lymphatic: No ascites or pneumoperitoneum. No abdominal or pelvic lymphadenopathy, by size criteria. No aneurysmal  dilation of the major abdominal arteries. There are mild peripheral atherosclerotic vascular calcifications of the aorta and its major branches. Reproductive: The uterus is surgically absent. No large adnexal mass. Other: The visualized soft tissues and abdominal wall are within normal limits. Musculoskeletal: No suspicious osseous lesions. Thoracolumbar spinal fixation hardware noted. There is diffuse osteopenia of the spine and probable ankylosis, not well evaluated due to patient related factors. IMPRESSION: 1. Overall examination is moderately limited due to patient related factors. 2. No acute inflammatory process identified within the abdomen or pelvis. 3. There is a partially exophytic heterogeneous at least 4.1 x 5.0 cm mass arising from the right kidney interpolar region, anteriorly which is highly concerning for neoplastic process. Further evaluation with contrast-enhanced MRI abdomen or CT scan abdomen, as per renal mass protocol is recommended. 4. Multiple other nonacute observations, as described above. 5. Aortic atherosclerosis. Aortic Atherosclerosis (ICD10-I70.0). Electronically Signed   By: Beula Brunswick M.D.   On: 08/28/2023 17:17   CT Head Wo Contrast Result Date: 08/28/2023 CLINICAL DATA:  Mental status change of unknown cause. Hallucinations. EXAM: CT HEAD WITHOUT CONTRAST TECHNIQUE: Contiguous axial images were obtained from the base of the skull through the vertex without intravenous contrast. RADIATION DOSE REDUCTION: This exam was performed according to the departmental dose-optimization program which includes automated exposure control, adjustment of the mA and/or kV according to patient size and/or use of iterative reconstruction technique. COMPARISON:  10/31/2006 FINDINGS: Brain: No change when compared to the study of 2008. Cerebellar atrophy without focal insult. Left cerebral hemisphere shows mild volume loss but no focal insult. On the right, there is a small cerebral hemisphere  which is probably longstanding. There is focal encephalomalacia in the right insula, basal ganglia and frontoparietal junction region. This probably relates to old vascular insult. No acute stroke, mass, hemorrhage, hydrocephalus or extra-axial collection. Vascular: There is atherosclerotic calcification of the major vessels at the base of the brain. Skull: No acute or focal finding. Sinuses/Orbits: Clear/normal Other: None IMPRESSION: No acute CT finding. Cerebellar atrophy. Small right cerebral hemisphere with focal encephalomalacia in the right insula, basal ganglia and frontoparietal junction region. This probably relates to old vascular insult. Electronically Signed   By: Bettylou Brunner M.D.   On: 08/28/2023 13:21   DG Chest Port 1 View Result Date: 08/28/2023 CLINICAL DATA:  Weakness.  Failure to thrive. EXAM: PORTABLE CHEST 1 VIEW COMPARISON:  08/05/2013 FINDINGS: Markedly rotated film. Due to rotation, cardiomediastinal anatomy obscures the left base. Interstitial markings are diffusely coarsened with chronic features. No definite airspace edema. No appreciable pleural effusion. Bones are diffusely demineralized. Thoracolumbar fusion hardware evident. Telemetry leads overlie the chest. IMPRESSION: 1. Markedly rotated film. 2. Chronic interstitial coarsening without definite acute cardiopulmonary findings. Electronically Signed   By: Donnal Fusi M.D.   On: 08/28/2023 12:01     Discharge Exam: Vitals:  08/28/23 1855 08/29/23 0339  BP: 103/61 (!) 96/50  Pulse: 91 80  Resp:  18  Temp: (!) 97.4 F (36.3 C) 97.7 F (36.5 C)  SpO2: 99% 96%   Vitals:   08/28/23 1627 08/28/23 1855 08/28/23 2100 08/29/23 0339  BP:  103/61  (!) 96/50  Pulse:  91  80  Resp:    18  Temp: 97.9 F (36.6 C) (!) 97.4 F (36.3 C)  97.7 F (36.5 C)  TempSrc: Oral Oral  Oral  SpO2:  99%  96%  Weight:   75.3 kg   Height:   5' (1.524 m)     General: Pt is alert, awake, not in acute distress Cardiovascular: RRR,  S1/S2 +, no rubs, no gallops Respiratory: CTA bilaterally, no wheezing, no rhonchi Abdominal: Soft, NT, ND, bowel sounds + Extremities: no edema, no cyanosis    The results of significant diagnostics from this hospitalization (including imaging, microbiology, ancillary and laboratory) are listed below for reference.     Microbiology: No results found for this or any previous visit (from the past 240 hours).   Labs: BNP (last 3 results) No results for input(s): "BNP" in the last 8760 hours. Basic Metabolic Panel: Recent Labs  Lab 08/28/23 1137  NA 139  K 3.8  CL 98  CO2 31  GLUCOSE 105*  BUN 13  CREATININE 0.48  CALCIUM 9.5  MG 2.2   Liver Function Tests: Recent Labs  Lab 08/28/23 1137  AST 14*  ALT 7  ALKPHOS 131*  BILITOT 0.2  PROT 6.6  ALBUMIN 3.5   Recent Labs  Lab 08/28/23 1137  LIPASE 27   No results for input(s): "AMMONIA" in the last 168 hours. CBC: Recent Labs  Lab 08/28/23 1137  WBC 4.8  NEUTROABS 2.8  HGB 11.8*  HCT 36.6  MCV 101.4*  PLT 256   Cardiac Enzymes: No results for input(s): "CKTOTAL", "CKMB", "CKMBINDEX", "TROPONINI" in the last 168 hours. BNP: Invalid input(s): "POCBNP" CBG: No results for input(s): "GLUCAP" in the last 168 hours. D-Dimer No results for input(s): "DDIMER" in the last 72 hours. Hgb A1c No results for input(s): "HGBA1C" in the last 72 hours. Lipid Profile No results for input(s): "CHOL", "HDL", "LDLCALC", "TRIG", "CHOLHDL", "LDLDIRECT" in the last 72 hours. Thyroid function studies Recent Labs    08/28/23 1356  TSH 1.069   Anemia work up No results for input(s): "VITAMINB12", "FOLATE", "FERRITIN", "TIBC", "IRON", "RETICCTPCT" in the last 72 hours. Urinalysis    Component Value Date/Time   COLORURINE YELLOW 08/28/2023 1154   APPEARANCEUR CLEAR 08/28/2023 1154   LABSPEC 1.021 08/28/2023 1154   PHURINE 7.0 08/28/2023 1154   GLUCOSEU NEGATIVE 08/28/2023 1154   HGBUR NEGATIVE 08/28/2023 1154    BILIRUBINUR NEGATIVE 08/28/2023 1154   KETONESUR NEGATIVE 08/28/2023 1154   PROTEINUR 30 (A) 08/28/2023 1154   UROBILINOGEN 0.2 08/05/2013 2057   NITRITE NEGATIVE 08/28/2023 1154   LEUKOCYTESUR NEGATIVE 08/28/2023 1154   Sepsis Labs Recent Labs  Lab 08/28/23 1137  WBC 4.8   Microbiology No results found for this or any previous visit (from the past 240 hours).   Time coordinating discharge: 35 minutes  SIGNED:   Cornelius Dill, DO [facility] 08/29/2023, 10:45 AM  If 7PM-7AM, please contact night-coverage [email or web address]

## 2023-08-29 NOTE — Plan of Care (Signed)

## 2023-08-31 NOTE — Telephone Encounter (Signed)
 Pt's sister called wanting to know why these medications are not being filled for her sister.  KEPPRA  750 MG tablet LAMICTAL  150 MG tablet DILANTIN  100 MG ER capsule  Please advise.

## 2023-09-03 ENCOUNTER — Other Ambulatory Visit: Payer: Self-pay | Admitting: Neurology

## 2023-09-03 MED ORDER — DILANTIN 100 MG PO CAPS: ORAL_CAPSULE | ORAL | 0 refills | Status: DC

## 2023-09-03 MED ORDER — KEPPRA 750 MG PO TABS: 1500.0000 mg | ORAL_TABLET | Freq: Two times a day (BID) | ORAL | 0 refills | Status: DC

## 2023-09-03 NOTE — Telephone Encounter (Signed)
 Patients sister needs to get this refilled today since she is out of medication and this usually gets delivered. Pharmacy closes at 6pm

## 2023-09-03 NOTE — Telephone Encounter (Signed)
 Refills have been sent for the lamotrigine  for the pt

## 2023-09-03 NOTE — Telephone Encounter (Signed)
 Patients cancelled 4/30 appt with Dr. Janett Medin because was in the hospital. Pharmacy called in prescription [facility]. Patients sister called again today to get refill because she only has today left for the Keppra .

## 2023-09-14 ENCOUNTER — Telehealth: Payer: Self-pay | Admitting: Neurology

## 2023-09-14 NOTE — Telephone Encounter (Signed)
 Pt called to confirm Appt Details  Appt details confirm.

## 2023-09-19 ENCOUNTER — Ambulatory Visit (INDEPENDENT_AMBULATORY_CARE_PROVIDER_SITE_OTHER): Admitting: Urology

## 2023-09-19 ENCOUNTER — Encounter: Payer: Self-pay | Admitting: Urology

## 2023-09-19 VITALS — BP 128/73 | HR 77

## 2023-09-19 DIAGNOSIS — N2889 Other specified disorders of kidney and ureter: Secondary | ICD-10-CM | POA: Diagnosis not present

## 2023-09-19 NOTE — Progress Notes (Addendum)
 09/19/2023 11:03 AM   Tamelia Michalowski Hammes 1956/02/25 [id number]  Referring provider: Doreene Gammon D, DO [address] [place],  [place] [address]  Renal mass   HPI:  Ms Devaux is a 68yo here for evaluation of a right renal mass. She was having diffuse abdominal pain and underwent CT on 4/29 without contrast and was found to have a right renal mass. 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 Calcium 9.5. She has >20 lb weight loss in the past 1-2 months. She has cerebral palsy and is confined to a wheelchair.   PMH: Past Medical History:  Diagnosis Date   Cerebral palsy (HCC)    Epilepsy (HCC)    Hypertension    Seizures (HCC)     Surgical History: Past Surgical History:  Procedure Laterality Date   ABDOMINAL HYSTERECTOMY     arm surgery     BACK SURGERY     BREAST SURGERY     cyst removal    Home Medications:  Allergies as of 09/19/2023       Reactions   Aspirin Other (See Comments)   Unknown    Benadryl [diphenhydramine Hcl (sleep)] Other (See Comments)   Unknown    Codeine Other (See Comments)   Unknown    Other Swelling, Other (See Comments)   An antibiotic pt received last time she was here before 08-28-23 Lip swelling   Phenobarbital Other (See Comments)   Unknown         Medication List        Accurate as of Sep 19, 2023 11:03 AM. If you have any questions, ask your nurse or doctor.          acetaminophen  650 MG CR tablet Commonly known as: TYLENOL  Take 1,300 mg by mouth every 8 (eight) hours as needed for pain.   amLODipine 5 MG tablet Commonly known as: NORVASC Take 5 mg by mouth daily.   ascorbic acid 500 MG tablet Commonly known as: VITAMIN C Take 500 mg by mouth daily.   carvedilol  6.25 MG tablet Commonly known as: COREG  Take 6.25 mg by mouth 2 (two) times daily with a meal.   D3 5000 125 MCG (5000 UT) capsule Generic  drug: Cholecalciferol Take 5,000 Units by mouth daily.   Dilantin  100 MG ER capsule Generic drug: phenytoin  TAKE (1) CAPSULE BY MOUTH TWICE DAILY.   famotidine  20 MG tablet Commonly known as: Pepcid  Take 1 tablet (20 mg total) by mouth 2 (two) times daily.   hydrochlorothiazide 25 MG tablet Commonly known as: HYDRODIURIL Take 25 mg by mouth daily. 12.5 mg daily   ibuprofen 200 MG tablet Commonly known as: ADVIL Take 200 mg by mouth every 6 (six) hours as needed for mild pain (pain score 1-3).   Keppra  750 MG tablet Generic drug: levETIRAcetam  Take 2 tablets (1,500 mg total) by mouth 2 (two) times daily.   LaMICtal  150 MG tablet Generic drug: lamoTRIgine  TAKE ONE TABLET BY MOUTH TWICE DAILY   latanoprost  0.005 % ophthalmic solution Commonly known as: XALATAN  Place 1 drop into both eyes at bedtime.   simvastatin 10 MG tablet Commonly known as: ZOCOR Take 10 mg by mouth at bedtime.        Allergies:  Allergies  Allergen Reactions   Aspirin Other (See Comments)    Unknown    Benadryl [Diphenhydramine Hcl (Sleep)] Other (See Comments)  Unknown    Codeine Other (See Comments)    Unknown    Other Swelling and Other (See Comments)    An antibiotic pt received last time she was here before 08-28-23 Lip swelling   Phenobarbital Other (See Comments)    Unknown     Family History: Family History  Problem Relation Age of Onset   Transient ischemic attack Mother     Social History:  reports that she has never smoked. She has never used smokeless tobacco. She reports that she does not drink alcohol and does not use drugs.  ROS: All other review of systems were reviewed and are negative except what is noted above in HPI  Physical Exam: BP 128/73   Pulse 77   Constitutional:  Alert and oriented, No acute distress. HEENT: [place] AT, moist mucus membranes.  Trachea midline, no masses. Cardiovascular: No clubbing, cyanosis, or edema. Respiratory: Normal respiratory  effort, no increased work of breathing. GI: Abdomen is soft, nontender, nondistended, no abdominal masses Lymph: No cervical or inguinal lymphadenopathy. Skin: No rashes, bruises or suspicious lesions. Psychiatric: Normal mood.  Laboratory Data: Lab Results  Component Value Date   WBC 4.8 08/28/2023   HGB 11.8 (L) 08/28/2023   HCT 36.6 08/28/2023   MCV 101.4 (H) 08/28/2023   PLT 256 08/28/2023    Lab Results  Component Value Date   CREATININE 0.48 08/28/2023    No results found for: "PSA"  No results found for: "TESTOSTERONE"  No results found for: "HGBA1C"  Urinalysis    Component Value Date/Time   COLORURINE YELLOW 08/28/2023 1154   APPEARANCEUR CLEAR 08/28/2023 1154   LABSPEC 1.021 08/28/2023 1154   PHURINE 7.0 08/28/2023 1154   GLUCOSEU NEGATIVE 08/28/2023 1154   HGBUR NEGATIVE 08/28/2023 1154   BILIRUBINUR NEGATIVE 08/28/2023 1154   KETONESUR NEGATIVE 08/28/2023 1154   PROTEINUR 30 (A) 08/28/2023 1154   UROBILINOGEN 0.2 08/05/2013 2057   NITRITE NEGATIVE 08/28/2023 1154   LEUKOCYTESUR NEGATIVE 08/28/2023 1154    Lab Results  Component Value Date   BACTERIA NONE SEEN 08/28/2023    Pertinent Imaging: CT 08/29/23: Images reviewed and discussed with the patient and family No results found for this or any previous visit.  No results found for this or any previous visit.  No results found for this or any previous visit.  No results found for this or any previous visit.  No results found for this or any previous visit.  No results found for this or any previous visit.  No results found for this or any previous visit.  No results found for this or any previous visit.   Assessment & Plan:    1. RIGHT RENAL MASS (Primary) We discussed the natural hx of renal masses and the malignant likelihood given IVC involvement. The patient is not a surgical candidate given her poor functional status and comorbidities. She will be referred to medical oncology for  palliative therapy  No follow-ups on file.  Johnie Nailer, MD  [facility] [place]

## 2023-09-19 NOTE — Patient Instructions (Signed)
 A Growth in the Kidney (Renal Mass): What to Know  A renal mass is an abnormal growth in the kidney. It may be found during an MRI, CT scan, or ultrasound that's done to check for other problems in the belly. Some renal masses are cancerous, or malignant, and can grow or spread quickly. Others are benign, which means they're not cancer. Renal masses include: Tumors. These may be either cancerous or benign. The most common cancerous tumor in adults is called renal cell carcinoma. In children, the most common type is Wilms tumor. The most common kidney tumors that aren't cancer include renal adenomas, oncocytomas, and angiomyolipomas (AML). Cysts. These are pockets of fluid that form on or in the kidney. What are the causes? Certain types of cancers, infections, or injuries can cause a renal mass. It's not always known what causes a cyst to form in or on the kidney. What are the signs or symptoms? Often, a renal mass or kidney cyst doesn't cause any signs or symptoms. How is this diagnosed? Your health care provider may suggest tests to diagnose the cause of your renal mass. These tests may include: A physical exam. Blood tests. Pee (urine) tests. Imaging tests, such as: CT scan. MRI. Ultrasound. Chest X-ray or bone scan. These may be done if a tumor is cancerous to see if the cancer has spread outside the kidney. Biopsy. This is when a small piece of tissue is removed from the renal mass for testing. How is this treated? Treatment is not always needed for a renal mass. Treatment will depend on the cause of the mass and if it's causing any problems or symptoms. For a cancerous renal mass, treatment options may include: Surgery. This is done to remove the tumor and any affected tissue. Chemotherapy. This uses medicines to kill cancer cells. Radiation. High-energy X-rays or gamma rays are used to kill cancer cells. Ablation. This uses extreme hot or cold temperature to kill the cancer  cells. Immunotherapy. Medicines are used to help the body's defense system (immune system) fight the cancer cells. Taking part in clinical trials. This involves trying new or experimental treatments to see if they're effective. Most kidney cysts don't need to be treated. Follow these instructions at home: What you need to do at home will depend on the cause of the mass. Follow the instructions that your provider gives you. In general: Take medicines only as told. If you were given antibiotics, take them as told. Do not stop taking them even if you start to feel better. Follow any instructions from your provider about what you can and can't do. Do not smoke, vape, or use nicotine or tobacco. Keep all follow-up visits. Your provider will need to check if your renal mass has changed or grown. Contact a health care provider if: You have flank pain, which is pain in your side or back. You have a fever. You have a loss of appetite. You have pain or swelling in your belly. You lose weight. Get help right away if: Your pain gets worse. There's blood in your pee. You can't pee. This information is not intended to replace advice given to you by your health care provider. Make sure you discuss any questions you have with your health care provider. Document Revised: 10/13/2022 Document Reviewed: 10/13/2022 Elsevier Patient Education  2024 [employer].

## 2023-09-26 ENCOUNTER — Inpatient Hospital Stay

## 2023-09-26 ENCOUNTER — Inpatient Hospital Stay: Attending: Oncology | Admitting: Oncology

## 2023-09-26 VITALS — BP 113/67 | HR 70 | Temp 97.7°F | Resp 17 | Ht 60.0 in

## 2023-09-26 DIAGNOSIS — N2889 Other specified disorders of kidney and ureter: Secondary | ICD-10-CM | POA: Insufficient documentation

## 2023-09-26 NOTE — Patient Instructions (Signed)
 VISIT SUMMARY:  Today, we discussed your recent significant weight loss and poor appetite, which may be related to a suspected kidney mass. We also reviewed your history of cerebral palsy, seizure disorder, and mental disability. A CT scan has revealed a kidney mass, and we are considering the next steps for diagnosis and treatment.  YOUR PLAN:  -KIDNEY CANCER: You have a probable kidney cancer with a mass that may be extending into a major blood vessel. Due to your current health status, you are not a good candidate for surgery, chemotherapy, or immunotherapy. Radiation therapy is also unlikely to be beneficial. We will order a chest CT to check if the cancer has spread to your chest and consult with Dr. Cipriano Creeks to evaluate the possibility of radiation therapy. We will also discuss hospice or palliative care options to manage your symptoms.  -WEIGHT LOSS AND POOR APPETITE: Your significant weight loss and poor appetite are likely related to the underlying cancer. We will monitor your nutritional status and consider ways to improve your appetite and weight.  -GOALS OF CARE: We discussed the importance of quality of life and your end-of-life care preferences. Your family is considering the impact of treatments on your quality of life. We will continue to have these discussions to ensure your care aligns with your wishes.  INSTRUCTIONS:  Please schedule a follow-up appointment in 1-2 weeks after your chest CT and consultation with Dr. Cipriano Creeks. We will discuss the results and the next steps in your care plan at that time.

## 2023-09-28 NOTE — Assessment & Plan Note (Addendum)
 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 Patient seen by urology and is considered a poor candidate for surgery Patient has an ECOG of 4 and is not a candidate for chemotherapy or immunotherapy Discussed risk versus benefits with patient's family in detail and that the kidney biopsy might be detrimental and even if the biopsy has proven it to be renal cell carcinoma, there are no further treatment options for the patient considering she has an ECOG of 4  - Patient's family will want to obtain a CT of chest to rule out metastasis and to know the extent of the disease - We discussed to meet in 1 week after the CT scan to discuss further treatment options. - We discussed hospice or palliative care as an option if they choose against biopsy   Return to clinic 1 week after CT scan to discuss results and further steps

## 2023-09-28 NOTE — Progress Notes (Signed)
 Hematology-Oncology Clinic Note  Wilburn Handler, MD   Reason for Referral: Right renal mass  Oncology History: I have reviewed her chart and materials related to her cancer extensively and collaborated history with the patient. Summary of oncologic history is as follows:  Oncology History  Right renal mass  08/28/2023 Initial Diagnosis   Right renal mass   08/28/2023 Imaging   CT abdomen and pelvis without contrast:  IMPRESSION: 1. Overall examination is moderately limited due to patient related factors. 2. No acute inflammatory process identified within the abdomen or pelvis. 3. There is a partially exophytic heterogeneous at least 4.1 x 5.0 cm mass arising from the right kidney interpolar region, anteriorly which is highly concerning for neoplastic process. Further evaluation with contrast-enhanced MRI abdomen or CT scan abdomen, as per renal mass protocol is recommended.   08/29/2023 Imaging   CT abdomen-renal protocol:  IMPRESSION: Large heterogeneous enhancing mass identified involving the inferior aspect of the right kidney. Extension of the mass into the renal hilum with involvement of the renal vein extending into the adjacent IVC over a segment of approximately 3.9 cm. Tumor in vein.         History of Presenting Illness: [name] 68 y.o. female is here for right renal mass.  Patient is accompanied by her sister and brother today.  Patient has a past medical history of cerebral palsy and seizure disorder and is bedridden.She was recently admitted to the hospital for failure to thrive and had a CT scan at that time done showing right renal mass.  She was then seen by urology who considered that she is a poor candidate for surgery and hence referred to us  for palliative options. Patient is a poor historian and all of the history has been obtained from sister and brother.  She lives with her sister and has been bedridden for the past 50 years.  She has some understanding  capacity but unsure of the extent.  Patient does not report any complaints but as per sister has been losing weight significantly for the past 1 year.  Her oral intake has decreased significantly.  She does not report any other complaints at this time.  I discussed with the brother and sister that the patient is of very poor functional status and even if we do the renal biopsy and if it is proven to be cancer, I would not be able to provide options of chemotherapy/immunotherapy.  We also discussed that radiation is not an usual option for renal cell carcinoma which is what this looks like based on imaging.  We discussed risk versus benefits in detail and also that kidney biopsy is at very high risk of bleeding.  Patient sister expressed her desire to see the extent of the disease and then decide on what they want to do next.  We decided on obtaining a CT of chest to rule out metastasis and meet after that to discuss further steps   Medical History: Past Medical History:  Diagnosis Date   Cerebral palsy (HCC)    Epilepsy (HCC)    Hypertension    Seizures (HCC)     Surgical history: Past Surgical History:  Procedure Laterality Date   ABDOMINAL HYSTERECTOMY     arm surgery     BACK SURGERY     BREAST SURGERY     cyst removal     Allergies:  is allergic to aspirin, benadryl [diphenhydramine hcl (sleep)], codeine, other, and phenobarbital.  Medications:  Current Outpatient  Medications  Medication Sig Dispense Refill   acetaminophen  (TYLENOL ) 650 MG CR tablet Take 1,300 mg by mouth every 8 (eight) hours as needed for pain.     amLODipine (NORVASC) 5 MG tablet Take 5 mg by mouth daily.     ascorbic acid (VITAMIN C) 500 MG tablet Take 500 mg by mouth daily.     carvedilol  (COREG ) 6.25 MG tablet Take 6.25 mg by mouth 2 (two) times daily with a meal.     Cholecalciferol (D3 5000) 125 MCG (5000 UT) capsule Take 5,000 Units by mouth daily.     DILANTIN  100 MG ER capsule TAKE (1) CAPSULE BY  MOUTH TWICE DAILY. 180 capsule 0   escitalopram (LEXAPRO) 10 MG tablet SMARTSIG:1 Tablet(s) By Mouth     famotidine  (PEPCID ) 20 MG tablet Take 1 tablet (20 mg total) by mouth 2 (two) times daily. 10 tablet 0   hydrochlorothiazide (HYDRODIURIL) 25 MG tablet Take 25 mg by mouth daily. 12.5 mg daily     ibuprofen (ADVIL) 200 MG tablet Take 200 mg by mouth every 6 (six) hours as needed for mild pain (pain score 1-3).     KEPPRA  750 MG tablet Take 2 tablets (1,500 mg total) by mouth 2 (two) times daily. 360 tablet 0   LAMICTAL  150 MG tablet TAKE ONE TABLET BY MOUTH TWICE DAILY 60 tablet 2   latanoprost  (XALATAN ) 0.005 % ophthalmic solution Place 1 drop into both eyes at bedtime.     simvastatin (ZOCOR) 10 MG tablet Take 10 mg by mouth at bedtime.     No current facility-administered medications for this visit.    Review of Systems: Constitutional: Denies fevers, chills or abnormal night sweats Eyes: Denies blurriness of vision, double vision or watery eyes Ears, nose, mouth, throat, and face: Denies mucositis or sore throat Respiratory: Denies cough, dyspnea or wheezes Cardiovascular: Denies palpitation, chest discomfort or lower extremity swelling Gastrointestinal:  Denies nausea, heartburn or change in bowel habits Skin: Denies abnormal skin rashes Lymphatics: Denies new lymphadenopathy or easy bruising Neurological:Denies numbness, tingling or new weaknesses Behavioral/Psych: Mood is stable, no new changes  All other systems were reviewed with the patient and are negative.  Physical Examination: ECOG PERFORMANCE STATUS: 4 - Bedbound  Vitals:   09/26/23 0820  BP: 113/67  Pulse: 70  Resp: 17  Temp: 97.7 F (36.5 C)  SpO2: 96%   There were no vitals filed for this visit.  GENERAL:alert, in a wheelchair  LUNGS: clear to auscultation and percussion with normal breathing effort HEART: regular rate & rhythm and no murmurs and no lower extremity edema ABDOMEN:abdomen soft, non-tender  and normal bowel sounds Musculoskeletal:no cyanosis of digits and no clubbing  PSYCH: Could not assess   Laboratory Data: I have reviewed the data as listed Lab Results  Component Value Date   WBC 4.8 08/28/2023   HGB 11.8 (L) 08/28/2023   HCT 36.6 08/28/2023   MCV 101.4 (H) 08/28/2023   PLT 256 08/28/2023   Recent Labs    05/31/23 2015 08/28/23 1137  NA 140 139  K 3.2* 3.8  CL 99 98  CO2 26 31  GLUCOSE 119* 105*  BUN 16 13  CREATININE 0.62 0.48  CALCIUM 9.1 9.5  GFRNONAA >60 >60  PROT  --  6.6  ALBUMIN  --  3.5  AST  --  14*  ALT  --  7  ALKPHOS  --  131*  BILITOT  --  0.2    Radiographic Studies: I have  personally reviewed the radiological images as listed and agreed with the findings in the report.  CT RENAL ABD W/WO CLINICAL DATA:  Renal mass.  EXAM: CT ABDOMEN WITHOUT AND WITH CONTRAST  TECHNIQUE: Multidetector CT imaging of the abdomen was performed following the standard protocol before and following the bolus administration of intravenous contrast.  RADIATION DOSE REDUCTION: This exam was performed according to the departmental dose-optimization program which includes automated exposure control, adjustment of the mA and/or kV according to patient size and/or use of iterative reconstruction technique.  CONTRAST:  OMNIPAQUE  IOHEXOL  300 MG/ML  SOLN  COMPARISON:  Noncontrast CT 08/28/2023.  FINDINGS: Lower chest: Breathing motion. There is linear changes along the lung bases. Enlarged pulmonary arteries. No pleural effusion at the extreme lung bases. Patient is extremely kyphotic.  Hepatobiliary: Gallbladder is nondilated. Patent portal vein. Small hepatic cystic lesions are identified. Segment 4 focus measures 10 mm and segment 3 measures 2.4 cm. The segment 3 lesion has some thin septa. Nonaggressive in appearance.  Pancreas: Unremarkable. No pancreatic ductal dilatation or surrounding inflammatory changes.  Spleen: Normal in size  without focal abnormality.  Adrenals/Urinary Tract: The adrenal glands are preserved. No left-sided enhancing renal mass or collecting system dilatation.  There is a clearly enhancing heterogeneous mass involving the anterior lower aspect of the right kidney with some central presumed necrosis. The renal component of the mass measures a proximally 5.7 by 5.4 by 4.6 cm. This has a exophytic component as well as significant phone extending into the renal sinus. No extension outside of Gerota's fascia. There is clear extension into the renal vein with tumor thrombus, tumor in vein throughout the main renal vein and extension into the IVC. Tumor in vein IVC extends cephalocaudal over a length of approximately 3.9 cm on sagittal series 10, image 62. No extension further into the more superior IVC. Several associated collateral vessels are identified.  Stomach/Bowel: Visualized bowel is nondilated. Prominent colonic stool. Fluid and debris in the stomach.  Vascular/Lymphatic: Normal caliber aorta with scattered vascular calcifications. No specific abnormal lymph node enlargement identified in the upper abdomen.  Other: Kyphotic examination. No definite free fluid or free air in the visualized portions of the abdomen.  Musculoskeletal: Streak artifact related to the patient's fixation Harrington rods along the spine. There is diffuse ankylosis of the spine with curvature and degenerative changes. Is also artifact as the arms were scanned at the patient's side.  IMPRESSION: Large heterogeneous enhancing mass identified involving the inferior aspect of the right kidney. Extension of the mass into the renal hilum with involvement of the renal vein extending into the adjacent IVC over a segment of approximately 3.9 cm. Tumor in vein.  Findings will be called to the ordering service by the Radiology physician assistant team.  Electronically Signed   By: Adrianna Horde M.D.   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 Patient seen by urology and is considered a poor candidate for surgery Patient has an ECOG of 4 and is not a candidate for chemotherapy or immunotherapy Discussed risk versus benefits with patient's family in detail and that the kidney biopsy might be detrimental and even if the biopsy has proven it to be renal cell carcinoma, there are no further treatment options for the  patient considering she has an ECOG of 4  - Patient's family will want to obtain a CT of chest to rule out metastasis and to know the extent of the disease - We discussed to meet in 1 week after the CT scan to discuss further treatment options. - We discussed hospice or palliative care as an option if they choose against biopsy   Return to clinic 1 week after CT scan to discuss results and further steps   Orders Placed This Encounter  Procedures   CT Chest W Contrast    Standing Status:   Future    Expected Date:   09/26/2023    Expiration Date:   09/25/2024    If indicated for the ordered procedure, I authorize the administration of contrast media per Radiology protocol:   Yes    Does the patient have a contrast media/X-ray dye allergy?:   No    Preferred imaging location?:   [facility]    The total time spent in the appointment was 60 minutes encounter with patients including review of chart and various tests results, discussions about plan of care and coordination of care plan  All questions were answered. The patient knows to call the clinic with any problems, questions or concerns. No barriers to learning was detected.  Eduardo Grade, MD 5/30/20252:41 PM

## 2023-10-03 ENCOUNTER — Ambulatory Visit (HOSPITAL_BASED_OUTPATIENT_CLINIC_OR_DEPARTMENT_OTHER)
Admission: RE | Admit: 2023-10-03 | Discharge: 2023-10-03 | Disposition: A | Discharge status: Home / Self Care | Source: Ambulatory Visit | Attending: Oncology | Admitting: Oncology

## 2023-10-03 DIAGNOSIS — N2889 Other specified disorders of kidney and ureter: Secondary | ICD-10-CM | POA: Insufficient documentation

## 2023-10-03 DIAGNOSIS — Z90710 Acquired absence of both cervix and uterus: Secondary | ICD-10-CM | POA: Insufficient documentation

## 2023-10-03 DIAGNOSIS — R1901 Right upper quadrant abdominal swelling, mass and lump: Secondary | ICD-10-CM | POA: Diagnosis not present

## 2023-10-03 DIAGNOSIS — J9811 Atelectasis: Secondary | ICD-10-CM | POA: Diagnosis not present

## 2023-10-03 DIAGNOSIS — I700 Atherosclerosis of aorta: Secondary | ICD-10-CM | POA: Diagnosis not present

## 2023-10-03 MED ORDER — IOHEXOL 300 MG/ML  SOLN
100.0000 mL | Freq: Once | INTRAMUSCULAR | Status: AC | PRN
  Administered 2023-10-03: 80 mL via INTRAVENOUS

## 2023-10-11 ENCOUNTER — Inpatient Hospital Stay: Attending: Oncology | Admitting: Oncology

## 2023-10-11 VITALS — BP 119/55 | HR 66 | Temp 98.7°F | Resp 20

## 2023-10-11 DIAGNOSIS — N2889 Other specified disorders of kidney and ureter: Secondary | ICD-10-CM | POA: Insufficient documentation

## 2023-10-11 DIAGNOSIS — Z79899 Other long term (current) drug therapy: Secondary | ICD-10-CM | POA: Diagnosis not present

## 2023-10-11 NOTE — Assessment & Plan Note (Signed)
 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 Patient seen by urology and is considered a poor candidate for surgery Patient has an ECOG of 4 and is not a candidate for chemotherapy or immunotherapy Discussed risk versus benefits with patient's family in detail and that the kidney biopsy might be detrimental and even if the biopsy has proven it to be renal cell carcinoma, there are no further treatment options for the patient considering she has an ECOG of 4.  I will reach out to Dr. Cipriano Creeks radiation oncology with [facility] to see if palliative radiation is an option for her. CT chest showed no evidence of metastatic disease.  - Patient family is in agreement not to do anything that is detrimental or invasive for her.  If radiation is not an option, they would consider hospice care. - Will refer to hospice - Will discuss with Dr. Cipriano Creeks to see if radiation is an option for her.  If it is, then we will send a referral and reach out to the patient regarding the same  No return to clinic at this time but can reconsider if plans change.

## 2023-10-11 NOTE — Progress Notes (Signed)
 Patient Care Team: Wilburn Handler, MD as PCP - General (Family Medicine)  Clinic Day:  10/11/2023  Referring physician: Wilburn Handler, MD   CHIEF COMPLAINT:  CC: Right renal mass    ASSESSMENT & PLAN:   Assessment & Plan: [name]  is a 68 y.o. female with right renal mass   Right renal mass Patient likely has renal cell carcinoma in the right kidney extending into the IVC. Patient seen by urology and is considered a poor candidate for surgery Patient has an ECOG of 4 and is not a candidate for chemotherapy or immunotherapy Discussed risk versus benefits with patient's family in detail and that the kidney biopsy might be detrimental and even if the biopsy has proven it to be renal cell carcinoma, there are no further treatment options for the patient considering she has an ECOG of 4.  I will reach out to Dr. Cipriano Creeks radiation oncology with [facility] to see if palliative radiation is an option for her. CT chest showed no evidence of metastatic disease.  - Patient family is in agreement not to do anything that is detrimental or invasive for her.  If radiation is not an option, they would consider hospice care. - Will refer to hospice - Will discuss with Dr. Cipriano Creeks to see if radiation is an option for her.  If it is, then we will send a referral and reach out to the patient regarding the same  No return to clinic at this time but can reconsider if plans change.    The patient understands the plans discussed today and is in agreement with them.  She knows to contact our office if she develops concerns prior to her next appointment.  I provided 30 minutes of face-to-face time during this encounter and > 50% was spent counseling as documented under my assessment and plan.    Eduardo Grade, MD  [facility]  - A DEPT OF [facility] [address] [place] [address] Dept: [phone] Dept Fax: [phone]   No  orders of the defined types were placed in this encounter.    ONCOLOGY HISTORY:   Oncology History  Right renal mass  08/28/2023 Initial Diagnosis   Right renal mass   08/28/2023 Imaging   CT abdomen and pelvis without contrast:  IMPRESSION: 1. Overall examination is moderately limited due to patient related factors. 2. No acute inflammatory process identified within the abdomen or pelvis. 3. There is a partially exophytic heterogeneous at least 4.1 x 5.0 cm mass arising from the right kidney interpolar region, anteriorly which is highly concerning for neoplastic process. Further evaluation with contrast-enhanced MRI abdomen or CT scan abdomen, as per renal mass protocol is recommended.   08/29/2023 Imaging   CT abdomen-renal protocol:  IMPRESSION: Large heterogeneous enhancing mass identified involving the inferior aspect of the right kidney. Extension of the mass into the renal hilum with involvement of the renal vein extending into the adjacent IVC over a segment of approximately 3.9 cm. Tumor in vein.     10/03/2023 Imaging   CT chest with contrast:  IMPRESSION: 1. No evidence of metastatic disease in the chest. 2. Stable 5.0 cm enhancing right lower pole renal mass with associated renal vein invasion       Current Treatment:  TBD  INTERVAL HISTORY:  [name] is here today for follow up. Patient is accompanied by her sister and brother today.  Patient reports no complaints  today.  We discussed that the CT scan of chest did not show any evidence of metastatic disease. I had an extensive discussion with the sister who is also the primary caretaker for the patient.  She expressed that she would not want to put her through something that is invasive or detrimental for her.  We discussed that radiation might be an option but I would discuss this with Dr. Cipriano Creeks at [facility] radiation oncology in [place] and will get back to them with the answer.   They are willing to consider hospice  care if that is the only option that we can offer at this time  I have reviewed the past medical history, past surgical history, social history and family history with the patient and they are unchanged from previous note.  ALLERGIES:  is allergic to aspirin, benadryl [diphenhydramine hcl (sleep)], codeine, other, and phenobarbital.  MEDICATIONS:  Current Outpatient Medications  Medication Sig Dispense Refill   acetaminophen  (TYLENOL ) 650 MG CR tablet Take 1,300 mg by mouth every 8 (eight) hours as needed for pain.     amLODipine (NORVASC) 5 MG tablet Take 5 mg by mouth daily.     ascorbic acid (VITAMIN C) 500 MG tablet Take 500 mg by mouth daily.     carvedilol  (COREG ) 6.25 MG tablet Take 6.25 mg by mouth 2 (two) times daily with a meal.     Cholecalciferol (D3 5000) 125 MCG (5000 UT) capsule Take 5,000 Units by mouth daily.     DILANTIN  100 MG ER capsule TAKE (1) CAPSULE BY MOUTH TWICE DAILY. 180 capsule 0   escitalopram (LEXAPRO) 10 MG tablet SMARTSIG:1 Tablet(s) By Mouth     famotidine  (PEPCID ) 20 MG tablet Take 1 tablet (20 mg total) by mouth 2 (two) times daily. 10 tablet 0   hydrochlorothiazide (HYDRODIURIL) 25 MG tablet Take 25 mg by mouth daily. 12.5 mg daily     ibuprofen (ADVIL) 200 MG tablet Take 200 mg by mouth every 6 (six) hours as needed for mild pain (pain score 1-3).     KEPPRA  750 MG tablet Take 2 tablets (1,500 mg total) by mouth 2 (two) times daily. 360 tablet 0   LAMICTAL  150 MG tablet TAKE ONE TABLET BY MOUTH TWICE DAILY 60 tablet 2   latanoprost  (XALATAN ) 0.005 % ophthalmic solution Place 1 drop into both eyes at bedtime.     simvastatin (ZOCOR) 10 MG tablet Take 10 mg by mouth at bedtime.     No current facility-administered medications for this visit.    REVIEW OF SYSTEMS:   Constitutional: Denies fevers, chills or abnormal weight loss Eyes: Denies blurriness of vision Ears, nose, mouth, throat, and face: Denies mucositis or sore throat Respiratory: Denies  cough, dyspnea or wheezes Cardiovascular: Denies palpitation, chest discomfort or lower extremity swelling Gastrointestinal:  Denies nausea, heartburn or change in bowel habits Skin: Denies abnormal skin rashes Lymphatics: Denies new lymphadenopathy or easy bruising Neurological:Denies numbness, tingling or new weaknesses Behavioral/Psych: Mood is stable, no new changes  All other systems were reviewed with the patient and are negative.   VITALS:  Blood pressure (!) 119/55, pulse 66, temperature 98.7 F (37.1 C), temperature source Tympanic, resp. rate 20, SpO2 97%.  Wt Readings from Last 3 Encounters:  08/28/23 166 lb 0.1 oz (75.3 kg)  08/29/22 166 lb (75.3 kg)  08/20/19 166 lb (75.3 kg)    There is no height or weight on file to calculate BMI.  Performance status (ECOG): 4 - Bedbound  PHYSICAL  EXAM:   GENERAL:alert, no distress and comfortable, in a wheelchair LYMPH:  no palpable lymphadenopathy in the cervical, axillary or inguinal LUNGS: clear to auscultation and percussion with normal breathing effort HEART: regular rate & rhythm and no murmurs and no lower extremity edema ABDOMEN:abdomen soft, non-tender and normal bowel sounds Musculoskeletal:no cyanosis of digits and no clubbing  NEURO: Patient is alert and seems to understand some part of the discussion   LABORATORY DATA:  I have reviewed the data as listed    Component Value Date/Time   NA 139 08/28/2023 1137   NA 144 08/20/2019 1620   K 3.8 08/28/2023 1137   CL 98 08/28/2023 1137   CO2 31 08/28/2023 1137   GLUCOSE 105 (H) 08/28/2023 1137   BUN 13 08/28/2023 1137   BUN 15 08/20/2019 1620   CREATININE 0.48 08/28/2023 1137   CALCIUM 9.5 08/28/2023 1137   PROT 6.6 08/28/2023 1137   PROT 7.6 08/20/2019 1620   ALBUMIN 3.5 08/28/2023 1137   ALBUMIN 4.6 08/20/2019 1620   AST 14 (L) 08/28/2023 1137   ALT 7 08/28/2023 1137   ALKPHOS 131 (H) 08/28/2023 1137   BILITOT 0.2 08/28/2023 1137   BILITOT 0.2 08/20/2019  1620   GFRNONAA >60 08/28/2023 1137   GFRAA 110 08/20/2019 1620    Lab Results  Component Value Date   WBC 4.8 08/28/2023   NEUTROABS 2.8 08/28/2023   HGB 11.8 (L) 08/28/2023   HCT 36.6 08/28/2023   MCV 101.4 (H) 08/28/2023   PLT 256 08/28/2023      Chemistry      Component Value Date/Time   NA 139 08/28/2023 1137   NA 144 08/20/2019 1620   K 3.8 08/28/2023 1137   CL 98 08/28/2023 1137   CO2 31 08/28/2023 1137   BUN 13 08/28/2023 1137   BUN 15 08/20/2019 1620   CREATININE 0.48 08/28/2023 1137      Component Value Date/Time   CALCIUM 9.5 08/28/2023 1137   ALKPHOS 131 (H) 08/28/2023 1137   AST 14 (L) 08/28/2023 1137   ALT 7 08/28/2023 1137   BILITOT 0.2 08/28/2023 1137   BILITOT 0.2 08/20/2019 1620       RADIOGRAPHIC STUDIES: I have personally reviewed the radiological images as listed and agreed with the findings in the report.  CT Chest W Contrast Result Date: 10/05/2023 EXAM: CT CHEST WITH CONTRAST 10/03/2023 05:45:00 PM TECHNIQUE: CT of the chest was performed with the administration of intravenous contrast. Multiplanar reformatted images are provided for review. Automated exposure control, iterative reconstruction, and/or weight based adjustment of the mA/kV was utilized to reduce the radiation dose to as low as reasonably achievable. COMPARISON: Chest radiograph dated 08/28/2023. Partial comparison to CT abdomen dated 08/29/2023. CLINICAL HISTORY: Rule out metastatic disease. R renal mass, r/o mets. Hx hysterectomy. FINDINGS: MEDIASTINUM: Mild cardiomegaly. The central airways are clear. Mild thoracic aortic atherosclerosis. LYMPH NODES: No mediastinal, hilar or axillary lymphadenopathy. LUNGS AND PLEURA: Mild atelectasis in the left upper and lower lobes. No suspicious pulmonary nodules. No pleural effusion or pneumothorax. SOFT TISSUES/BONES: Thoracolumbar dextroscoliosis with spinal fixation hardware. No acute abnormality of the soft tissues. UPPER ABDOMEN: 5.0 cm  enhancing right lower pole renal mass, unchanged, with associated renal vein invasion. IMPRESSION: 1. No evidence of metastatic disease in the chest. 2. Stable 5.0 cm enhancing right lower pole renal mass with associated renal vein invasion. Electronically signed by: Zadie Herter MD 10/05/2023 12:12 AM EDT RP Workstation: [id number]

## 2023-10-15 DIAGNOSIS — N2889 Other specified disorders of kidney and ureter: Secondary | ICD-10-CM | POA: Insufficient documentation

## 2023-10-15 DIAGNOSIS — R634 Abnormal weight loss: Secondary | ICD-10-CM | POA: Insufficient documentation

## 2023-10-15 DIAGNOSIS — G40219 Localization-related (focal) (partial) symptomatic epilepsy and epileptic syndromes with complex partial seizures, intractable, without status epilepticus: Secondary | ICD-10-CM | POA: Insufficient documentation

## 2023-10-15 DIAGNOSIS — I10 Essential (primary) hypertension: Secondary | ICD-10-CM | POA: Insufficient documentation

## 2023-10-15 DIAGNOSIS — G809 Cerebral palsy, unspecified: Secondary | ICD-10-CM | POA: Insufficient documentation

## 2023-10-30 DIAGNOSIS — N2889 Other specified disorders of kidney and ureter: Secondary | ICD-10-CM | POA: Diagnosis not present

## 2023-10-30 DIAGNOSIS — C641 Malignant neoplasm of right kidney, except renal pelvis: Secondary | ICD-10-CM | POA: Diagnosis not present

## 2023-11-12 ENCOUNTER — Telehealth: Payer: Self-pay | Admitting: Neurology

## 2023-11-12 DIAGNOSIS — R441 Visual hallucinations: Secondary | ICD-10-CM | POA: Diagnosis not present

## 2023-11-12 DIAGNOSIS — G40309 Generalized idiopathic epilepsy and epileptic syndromes, not intractable, without status epilepticus: Secondary | ICD-10-CM | POA: Diagnosis not present

## 2023-11-12 DIAGNOSIS — E7800 Pure hypercholesterolemia, unspecified: Secondary | ICD-10-CM | POA: Diagnosis not present

## 2023-11-12 DIAGNOSIS — G8194 Hemiplegia, unspecified affecting left nondominant side: Secondary | ICD-10-CM | POA: Diagnosis not present

## 2023-11-12 DIAGNOSIS — R440 Auditory hallucinations: Secondary | ICD-10-CM | POA: Diagnosis not present

## 2023-11-12 DIAGNOSIS — R634 Abnormal weight loss: Secondary | ICD-10-CM | POA: Diagnosis not present

## 2023-11-12 DIAGNOSIS — Z85528 Personal history of other malignant neoplasm of kidney: Secondary | ICD-10-CM | POA: Diagnosis not present

## 2023-11-12 DIAGNOSIS — G804 Ataxic cerebral palsy: Secondary | ICD-10-CM | POA: Diagnosis not present

## 2023-11-12 NOTE — Telephone Encounter (Signed)
 Pt is requesting a refill for DILANTIN  100 MG ER capsule  .  Pharmacy: [facility]

## 2023-11-13 NOTE — Telephone Encounter (Signed)
 Pt sister was called, the message from RN was relayed to her, sister confirmed pt will keep upcoming appointment.

## 2023-11-13 NOTE — Telephone Encounter (Signed)
 Pt medication was sent and filled by [facility] on 11/11/2023 for a 30 day supply. Pt should be ok until August with her medication. She has not been seen in over a year and has an upcoming apt scheduled 11/22/23. As long as patient keeps upcoming appt, we can send in more refills after the visit.

## 2023-11-22 ENCOUNTER — Ambulatory Visit: Admitting: Neurology

## 2023-11-23 ENCOUNTER — Other Ambulatory Visit: Payer: Self-pay | Admitting: Neurology

## 2023-11-30 ENCOUNTER — Telehealth: Payer: Self-pay | Admitting: Neurology

## 2023-11-30 MED ORDER — KEPPRA 750 MG PO TABS: 1500.0000 mg | ORAL_TABLET | Freq: Two times a day (BID) | ORAL | 0 refills | Status: DC

## 2023-11-30 NOTE — Telephone Encounter (Signed)
 Pt is requesting a refill for KEPPRA  750 MG tablet .  Pharmacy: [facility]

## 2023-12-04 ENCOUNTER — Encounter: Payer: Self-pay | Admitting: Neurology

## 2023-12-04 ENCOUNTER — Ambulatory Visit (INDEPENDENT_AMBULATORY_CARE_PROVIDER_SITE_OTHER): Admitting: Neurology

## 2023-12-04 ENCOUNTER — Other Ambulatory Visit: Payer: Medicare HMO

## 2023-12-04 VITALS — BP 118/59 | HR 68

## 2023-12-04 DIAGNOSIS — G40009 Localization-related (focal) (partial) idiopathic epilepsy and epileptic syndromes with seizures of localized onset, not intractable, without status epilepticus: Secondary | ICD-10-CM | POA: Diagnosis not present

## 2023-12-04 MED ORDER — KEPPRA 750 MG PO TABS: 1500.0000 mg | ORAL_TABLET | Freq: Two times a day (BID) | ORAL | 6 refills | Status: AC

## 2023-12-04 MED ORDER — DILANTIN 100 MG PO CAPS: ORAL_CAPSULE | ORAL | 3 refills | Status: AC

## 2023-12-04 MED ORDER — LAMICTAL 150 MG PO TABS: 150.0000 mg | ORAL_TABLET | Freq: Two times a day (BID) | ORAL | 11 refills | Status: AC

## 2023-12-04 NOTE — Patient Instructions (Addendum)
 I had a long discussion with patient and her sister regarding her cerebral palsy and long epilepsy seems quite well-controlled on the current medication regimen.  Continue Dilantin  100 mg twice daily, Keppra  1500 mg twice daily, and Lamictal  150 mg twice daily -refills up-to-date.  Needs to remain on brand-name medications as previously failed generic medication..Check CMP and cbc today.  She was given refills of her seizure medications for 1 year.  Return for follow-up in the future in a year  with nurse practitioner and request video visit or call earlier if necessary. I

## 2023-12-04 NOTE — Progress Notes (Signed)
 [facility]  PATIENT: [name] DOB: 1956-03-23   REASON FOR VISIT: Follow-up for myoclonus jerks, cerebral palsy and seizure disorder HISTORY FROM: Patient and sister [name]   Chief complaint: Chief Complaint  Patient presents with   RM16    Pt is here with her Sister. Pt's sister states that pt has been having the jerks. Pt' sister states that pt has a mass on her kidney.       HISTORY OF PRESENT ILLNESS:  Ms. [name] is a 68 year old female who continues to be followed in this office with Dr. Rosemarie for long-term for seizure disorder management and monitoring.  She has been seizure-free since 2009.  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  She is accompanied by her sister.  Patient continues to do well without any generalized tonic-clonic seizures for 15 years.  She continues to have intermittent myoclonic jerks which occur at a variable frequency.  She may go for several months without it and then have them daily for a week or 2 and then stop.  There are no obvious triggers.  She continues to stay on her anticonvulsant regimen of Dilantin  100 mg twice daily, lamotrigine  150 twice daily and Keppra  1500 mg twice daily.  She seems to be tolerating these well without side effects.  She has been con compliant with her medications.  She remains wheelchair-bound and nonambulatory but is able to converse and follow commands.  Family is requesting subsequent office visits be virtual as it is difficult to transport the patient.  She has been diagnosed with a renal mass but decision has been made for conservative follow-up. Update 08/29/2022 : She returns for follow-up after last visit a year ago.  She is accompanied by her sister.  Patient continues to do well and has not had any generalized tonic-clonic seizures 2009.  She continues to have intermittent myoclonic  jerks which occur 4-5 times a month.,  May last variably from minutes to hours and rarely hold day.  These are nondisabling.  The sister feels that he is related to patient being constipated.  The current medication regimen of Dilantin , Keppra  and Lamictal  which seems to be working well.  She denies any side effects.  He is wheelchair-bound and is nonambulatory.  Her sister feels she is a lot more scoliosis and cannot sit straight and keeps on leaning to the left.  She had lab work done in primary care physician's office 2 months ago but I did not close results reviewed today.  She was admitted to the hospital on 08/28/2023 for failure to thrive and CT scan of the abdomen showed a right renal mass she was seen by urology but not felt to be a surgical candidate for removal of the renal mass.  Update 08/24/2021 JM: Patient returns for yearly seizure follow-up after prior visit 08/19/2020.  She has been doing well over the past year and has had no reoccurring seizure activity.  Remains on phenytoin , lamotrigine  and levetiracetam , denies side effects.  Reports recent lab work by PCP but unable to view via epic and sister unsure what lab work was completed. Sister does mention some issues with leaning more towards the left side which has causes some issues with appropriately operating a motorized wheelchair. She is concerned if this continues to worsen, she may have difficulties eating or even being able to sit in w/c.  She  does have a herringbone rod in place.  No further concerns at this time.      REVIEW OF SYSTEMS: Full 14 system review of systems performed and notable only for those listed, all others are neg:  Constitutional: neg  Cardiovascular: neg Ear/Nose/Throat: Drooling Skin: neg Eyes: neg Respiratory: neg Gastroitestinal: neg  Hematology/Lymphatic: neg  Endocrine: neg Musculoskeletal: Wheelchair-bound Allergy/Immunology: neg Neurological: History of seizure disorder, cerebral  palsy Psychiatric: neg Sleep : neg   ALLERGIES: Allergies  Allergen Reactions   Aspirin Other (See Comments)    Unknown    Benadryl [Diphenhydramine Hcl (Sleep)] Other (See Comments)    Unknown    Codeine Other (See Comments)    Unknown    Other Swelling and Other (See Comments)    An antibiotic pt received last time she was here before 08-28-23 Lip swelling   Phenobarbital Other (See Comments)    Unknown     HOME MEDICATIONS: Outpatient Medications Prior to Visit  Medication Sig Dispense Refill   amLODipine (NORVASC) 5 MG tablet Take 5 mg by mouth daily.     carvedilol  (COREG ) 6.25 MG tablet Take 6.25 mg by mouth 2 (two) times daily with a meal.     DILANTIN  100 MG ER capsule TAKE (1) CAPSULE BY MOUTH TWICE DAILY. 180 capsule 0   escitalopram (LEXAPRO) 10 MG tablet SMARTSIG:1 Tablet(s) By Mouth     famotidine  (PEPCID ) 20 MG tablet Take 1 tablet (20 mg total) by mouth 2 (two) times daily. 10 tablet 0   hydrochlorothiazide (HYDRODIURIL) 25 MG tablet Take 25 mg by mouth daily. 12.5 mg daily     KEPPRA  750 MG tablet Take 2 tablets (1,500 mg total) by mouth 2 (two) times daily. 120 tablet 0   LAMICTAL  150 MG tablet TAKE ONE TABLET BY MOUTH TWICE DAILY 60 tablet 2   simvastatin (ZOCOR) 10 MG tablet Take 10 mg by mouth at bedtime.     acetaminophen  (TYLENOL ) 650 MG CR tablet Take 1,300 mg by mouth every 8 (eight) hours as needed for pain. (Patient not taking: Reported on 12/04/2023)     ascorbic acid (VITAMIN C) 500 MG tablet Take 500 mg by mouth daily. (Patient not taking: Reported on 12/04/2023)     Cholecalciferol (D3 5000) 125 MCG (5000 UT) capsule Take 5,000 Units by mouth daily. (Patient not taking: Reported on 12/04/2023)     ibuprofen (ADVIL) 200 MG tablet Take 200 mg by mouth every 6 (six) hours as needed for mild pain (pain score 1-3). (Patient not taking: Reported on 12/04/2023)     latanoprost  (XALATAN ) 0.005 % ophthalmic solution Place 1 drop into both eyes at bedtime. (Patient not  taking: Reported on 12/04/2023)     No facility-administered medications prior to visit.    PAST MEDICAL HISTORY: Past Medical History:  Diagnosis Date   Cerebral palsy (HCC)    Epilepsy (HCC)    Hypertension    Seizures (HCC)     PAST SURGICAL HISTORY: Past Surgical History:  Procedure Laterality Date   ABDOMINAL HYSTERECTOMY     arm surgery     BACK SURGERY     BREAST SURGERY     cyst removal    FAMILY HISTORY: Family History  Problem Relation Age of Onset   Transient ischemic attack Mother     SOCIAL HISTORY: Social History   Socioeconomic History   Marital status: Single    Spouse name: Not on file   Number of children: Not on file   Years of  education: Not on file   Highest education level: Not on file  Occupational History   Not on file  Tobacco Use   Smoking status: Never   Smokeless tobacco: Never  Substance and Sexual Activity   Alcohol use: No   Drug use: No   Sexual activity: Not on file  Other Topics Concern   Not on file  Social History Narrative   Lives with sister, [name]   Social Drivers of Health   Financial Resource Strain: Not on file  Food Insecurity: No Food Insecurity (09/26/2023)   Hunger Vital Sign    Worried About Running Out of Food in the Last Year: Never true    Ran Out of Food in the Last Year: Never true  Transportation Needs: No Transportation Needs (09/26/2023)   PRAPARE - Administrator, Civil Service (Medical): No    Lack of Transportation (Non-Medical): No  Physical Activity: Not on file  Stress: Not on file  Social Connections: Patient Unable To Answer (08/28/2023)   Social Connection and Isolation Panel    Frequency of Communication with Friends and Family: Patient unable to answer    Frequency of Social Gatherings with Friends and Family: Patient unable to answer    Attends Religious Services: Patient unable to answer    Active Member of Clubs or Organizations: Patient unable to answer    Attends Occupational hygienist Meetings: Patient unable to answer    Marital Status: Patient unable to answer  Intimate Partner Violence: Not At Risk (09/26/2023)   Humiliation, Afraid, Rape, and Kick questionnaire    Fear of Current or Ex-Partner: No    Emotionally Abused: No    Physically Abused: No    Sexually Abused: No     PHYSICAL EXAM Today's Vitals   12/04/23 1612  BP: (!) 118/59  Pulse: 68    There is no height or weight on file to calculate BMI.   General: well developed, well nourished, pleasant middle-aged African-American female, seated, in no evident distress Head: head normocephalic and atraumatic.   Neck: supple with no carotid or supraclavicular bruits Cardiovascular: regular rate and rhythm, no murmurs Musculoskeletal: CP Skin:  no rash/petichiae Vascular:  Normal pulses all extremities   Neurologic Exam Mental Status: Awake and fully alert.   Severely dysarthric.  Oriented to place and time. Recent and remote memory intact. Attention span, concentration and fund of knowledge appropriate. Mood and affect appropriate.  Cranial Nerves: Pupils equal, briskly reactive to light. Extraocular movements full without nystagmus. Visual fields full to confrontation. Hearing intact. Facial sensation intact.  Mild left lower facial weakness.  Tongue, and palate moves normally and symmetrically.  Motor: Normal strength right upper and lower extremity except weakness of right grip and intrinsic hand muscles.. 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 Sensory.: intact to touch , pinprick , position and vibratory sensation.  Coordination: Rapid alternating movements normal in all extremities on the right.  Gait and Station: Deferred as patient wheelchair-bound Reflexes: 1+ and symmetric. Toes downgoing.         ASSESSMENT AND PLAN 68 y.o. year old female  has a past medical history of Epilepsy who continues to be followed for  ongoing monitoring and management.  No generalized seizure activity since 2009 but intermittent brief myoclonic jerks continue which have been stable.  hx of cerebral palsy and wheelchair-bound   I had a long discussion with patient and her sister regarding her cerebral palsy  and long epilepsy seems quite well-controlled on the current medication regimen.  Continue Dilantin  100 mg twice daily, Keppra  1500 mg twice daily, and Lamictal  150 mg twice daily -refills up-to-date.  Needs to remain on brand-name medications as previously failed generic medication..Check CMP and cbc today.  She was given refills of her seizure medications for 1 year.  Return for follow-up in the future in a year  with nurse practitioner and request video visit or call earlier if necessary.   Eather Popp, MD  [facility] [address] [place], [place] [address]  Phone [phone] Fax [phone] Note: This document was prepared with digital dictation and possible smart phrase technology. Any transcriptional errors that result from this process are unintentional.

## 2023-12-05 LAB — CBC
Hematocrit: 36.8 % (ref 34.0–46.6)
Hemoglobin: 12.2 g/dL (ref 11.1–15.9)
MCH: 31.3 pg (ref 26.6–33.0)
MCHC: 33.2 g/dL (ref 31.5–35.7)
MCV: 94 fL (ref 79–97)
Platelets: 201 x10E3/uL (ref 150–450)
RBC: 3.9 x10E6/uL (ref 3.77–5.28)
RDW: 11.5 % — ABNORMAL LOW (ref 11.7–15.4)
WBC: 4.7 x10E3/uL (ref 3.4–10.8)

## 2023-12-05 LAB — COMPREHENSIVE METABOLIC PANEL WITH GFR
ALT: 7 IU/L (ref 0–32)
AST: 15 IU/L (ref 0–40)
Albumin: 4.4 g/dL (ref 3.9–4.9)
Alkaline Phosphatase: 249 IU/L — ABNORMAL HIGH (ref 44–121)
BUN/Creatinine Ratio: 23 (ref 12–28)
BUN: 15 mg/dL (ref 8–27)
Bilirubin Total: 0.3 mg/dL (ref 0.0–1.2)
CO2: 27 mmol/L (ref 20–29)
Calcium: 9.9 mg/dL (ref 8.7–10.3)
Chloride: 100 mmol/L (ref 96–106)
Creatinine, Ser: 0.66 mg/dL (ref 0.57–1.00)
Globulin, Total: 2.8 g/dL (ref 1.5–4.5)
Glucose: 84 mg/dL (ref 70–99)
Potassium: 4 mmol/L (ref 3.5–5.2)
Sodium: 144 mmol/L (ref 134–144)
Total Protein: 7.2 g/dL (ref 6.0–8.5)
eGFR: 96 mL/min/1.73 (ref >59)

## 2023-12-06 ENCOUNTER — Telehealth: Payer: Self-pay | Admitting: Neurology

## 2023-12-06 NOTE — Telephone Encounter (Signed)
 [name] from [facility] called  to request to speak to MD . [name]  is requesting Pt  medication (KEPPRA  750 MG tablet) to be  changed to generic brand  .Pt is taking  KEPPRA  750 MG tablet  at this time   Callback number is [phone]

## 2023-12-07 ENCOUNTER — Other Ambulatory Visit (HOSPITAL_COMMUNITY): Payer: Self-pay

## 2023-12-07 ENCOUNTER — Telehealth: Payer: Self-pay

## 2023-12-07 NOTE — Telephone Encounter (Signed)
 Pharmacy Patient Advocate Encounter   Received notification from Fax that prior authorization for LaMICtal  150MG  tablets is required/requested.   Insurance verification completed.   The patient is insured through [employer] .   Per test claim: Refill too soon. PA is not needed at this time. Medication was filled 12/04/2023. Next eligible fill date is 12/27/2023.

## 2023-12-07 NOTE — Telephone Encounter (Signed)
 Attempted to call [facility] w/[facility]. No answer, LVM for call back.

## 2023-12-07 NOTE — Telephone Encounter (Signed)
 Pharmacy Patient Advocate Encounter   Received notification from CoverMyMeds that prior authorization for Dilantin  100MG  capsules is required/requested.   Insurance verification completed.   The patient is insured through [employer] .   Per test claim: Refill too soon. PA is not needed at this time. Medication was filled 12/06/2023. Next eligible fill date is 02/19/2024.

## 2023-12-10 ENCOUNTER — Telehealth: Payer: Self-pay | Admitting: Neurology

## 2023-12-10 NOTE — Telephone Encounter (Signed)
 Pt nurse [name] from [facility]  called to follow up about Pt medication being changed  to generic . [name] wanted to know if the medication that was requested for Pt  are the generic brands .   LAMICTAL  150 MG tablet   Levetiracetam  Callback [phone]

## 2023-12-10 NOTE — Telephone Encounter (Signed)
 Attempted to call [facility] w/[facility]. No answer, LVM for call back. Cld [facility] at [phone] and spoke w/Erica who stated Pt nurse is [name]. Twyla Frieze info regarding ok to switch Pt to generic Keppra  from Dr. Rosemarie. Frieze stated she will give info to nurse and voiced thanks for the call.

## 2023-12-12 ENCOUNTER — Other Ambulatory Visit

## 2023-12-12 NOTE — Telephone Encounter (Signed)
 Spoke w/[name], [facility] to make her aware Dr. Rosemarie is in agreement to change Lamictal  and Dilantin  both to generic. [name] voiced understanding and thanks for the call back.

## 2023-12-14 ENCOUNTER — Ambulatory Visit: Payer: Self-pay | Admitting: Neurology

## 2023-12-14 NOTE — Progress Notes (Signed)
 CBC and blood chemistry are mostly okay except alkaline phosphatase is high which has been up-and-down in the past probably not significant

## 2023-12-17 ENCOUNTER — Telehealth: Payer: Self-pay

## 2023-12-17 NOTE — Telephone Encounter (Signed)
-----   Message from [place] sent at 12/14/2023  3:31 PM EDT ----- CBC and blood chemistry are mostly okay except alkaline phosphatase is high which has been up-and-down in the past probably not significant ----- Message ----- From: Interface, [facility] Lab Results In Sent: 12/05/2023   5:36 AM EDT To: Eather GORMAN Popp, MD

## 2023-12-17 NOTE — Telephone Encounter (Signed)
 Called the patient and reviewed the blood work with her. Informed of the results and findings. Instructed I will send a copy of these results to the PCP so they can also follow up and recheck levels as needed.

## 2023-12-17 NOTE — Telephone Encounter (Signed)
 Patient's sister Joyce Blackstock    Doesn't want to bring patient to office.  Would like to make Friday, August 22 a telephone visit.  Please advise.  Call:  [phone]

## 2023-12-21 ENCOUNTER — Telehealth: Admitting: Urology

## 2023-12-24 ENCOUNTER — Other Ambulatory Visit

## 2024-01-02 ENCOUNTER — Other Ambulatory Visit

## 2024-01-18 ENCOUNTER — Ambulatory Visit
Admission: RE | Admit: 2024-01-18 | Discharge: 2024-01-18 | Disposition: A | Discharge status: Home / Self Care | Source: Ambulatory Visit | Attending: Family Medicine | Admitting: Family Medicine

## 2024-01-18 DIAGNOSIS — N6310 Unspecified lump in the right breast, unspecified quadrant: Secondary | ICD-10-CM

## 2024-01-18 DIAGNOSIS — N6489 Other specified disorders of breast: Secondary | ICD-10-CM | POA: Diagnosis not present

## 2024-01-21 ENCOUNTER — Telehealth: Admitting: Urology

## 2024-01-29 ENCOUNTER — Telehealth: Payer: Self-pay

## 2024-01-29 NOTE — Telephone Encounter (Signed)
 Sister said that medical oncology did not want to treat her and you did not want to treat her so she was wanting to know what was the purpose of the visit. Will this be a 6 month or yearly check up just to see where she is at? Will she need to get any future imaging? Sister advised that a message would be sent to the doctor and someone will reach back out with his recommendations.

## 2024-01-29 NOTE — Telephone Encounter (Signed)
 Sister [name] Rowels wants to know purpose of patient visit.   Having issues with MyChart Video Visit.   Was given IT number to call to correct issues.    Call:  [phone] If no answer please leave a message.

## 2024-01-29 NOTE — Telephone Encounter (Signed)
 Returned call with no answer. Detailed message left informing tele visit was a follow up visit from May after being referred to medical oncology. Sister advised to call to reschedule once IT issues are fixed.

## 2024-02-08 NOTE — Telephone Encounter (Signed)
 Sister called with no answer. Detailed message left on voicemail.

## 2024-02-29 DIAGNOSIS — G804 Ataxic cerebral palsy: Secondary | ICD-10-CM | POA: Diagnosis not present

## 2024-02-29 DIAGNOSIS — G40309 Generalized idiopathic epilepsy and epileptic syndromes, not intractable, without status epilepticus: Secondary | ICD-10-CM | POA: Diagnosis not present

## 2024-02-29 DIAGNOSIS — Z85528 Personal history of other malignant neoplasm of kidney: Secondary | ICD-10-CM | POA: Diagnosis not present

## 2024-02-29 DIAGNOSIS — L89212 Pressure ulcer of right hip, stage 2: Secondary | ICD-10-CM | POA: Diagnosis not present

## 2024-02-29 DIAGNOSIS — R440 Auditory hallucinations: Secondary | ICD-10-CM | POA: Diagnosis not present

## 2024-02-29 DIAGNOSIS — R441 Visual hallucinations: Secondary | ICD-10-CM | POA: Diagnosis not present

## 2024-02-29 DIAGNOSIS — R634 Abnormal weight loss: Secondary | ICD-10-CM | POA: Diagnosis not present

## 2024-02-29 DIAGNOSIS — G8194 Hemiplegia, unspecified affecting left nondominant side: Secondary | ICD-10-CM | POA: Diagnosis not present

## 2024-12-04 ENCOUNTER — Telehealth: Admitting: Neurology
